# Patient Record
Sex: Female | Born: 1955
Health system: Southern US, Community
[De-identification: ages and names within clinical notes are randomized; demographics above are authoritative.]

## PROBLEM LIST (undated history)

## (undated) DIAGNOSIS — H409 Unspecified glaucoma: Secondary | ICD-10-CM

## (undated) DIAGNOSIS — Z86010 Personal history of colonic polyps: Secondary | ICD-10-CM

## (undated) DIAGNOSIS — H269 Unspecified cataract: Secondary | ICD-10-CM

## (undated) DIAGNOSIS — M810 Age-related osteoporosis without current pathological fracture: Secondary | ICD-10-CM

## (undated) HISTORY — PX: COLONOSCOPY: SHX174

## (undated) HISTORY — DX: Unspecified glaucoma: H40.9

## (undated) HISTORY — PX: ABDOMINAL HYSTERECTOMY: SHX81

## (undated) HISTORY — DX: Unspecified cataract: H26.9

## (undated) HISTORY — DX: Personal history of colonic polyps: Z86.010

## (undated) HISTORY — PX: COLON SURGERY: SHX602

## (undated) HISTORY — DX: Age-related osteoporosis without current pathological fracture: M81.0

---

## 1999-05-20 ENCOUNTER — Other Ambulatory Visit: Admission: RE | Admit: 1999-05-20 | Discharge: 1999-05-20 | Payer: Self-pay | Admitting: Obstetrics and Gynecology

## 1999-10-02 ENCOUNTER — Encounter: Admission: RE | Admit: 1999-10-02 | Discharge: 1999-10-02 | Payer: Self-pay | Admitting: Obstetrics and Gynecology

## 1999-10-02 ENCOUNTER — Encounter: Payer: Self-pay | Admitting: Obstetrics and Gynecology

## 2001-03-06 ENCOUNTER — Encounter: Payer: Self-pay | Admitting: Obstetrics and Gynecology

## 2001-03-06 ENCOUNTER — Encounter: Admission: RE | Admit: 2001-03-06 | Discharge: 2001-03-06 | Payer: Self-pay | Admitting: Obstetrics and Gynecology

## 2001-03-20 ENCOUNTER — Other Ambulatory Visit: Admission: RE | Admit: 2001-03-20 | Discharge: 2001-03-20 | Payer: Self-pay | Admitting: Obstetrics and Gynecology

## 2002-03-05 ENCOUNTER — Encounter: Payer: Self-pay | Admitting: Gynecology

## 2002-03-05 ENCOUNTER — Encounter: Admission: RE | Admit: 2002-03-05 | Discharge: 2002-03-05 | Payer: Self-pay | Admitting: Gynecology

## 2002-03-08 ENCOUNTER — Encounter: Payer: Self-pay | Admitting: Gynecology

## 2002-03-08 ENCOUNTER — Encounter: Admission: RE | Admit: 2002-03-08 | Discharge: 2002-03-08 | Payer: Self-pay | Admitting: Gynecology

## 2002-03-28 ENCOUNTER — Other Ambulatory Visit: Admission: RE | Admit: 2002-03-28 | Discharge: 2002-03-28 | Payer: Self-pay | Admitting: Gynecology

## 2003-03-11 ENCOUNTER — Encounter: Admission: RE | Admit: 2003-03-11 | Discharge: 2003-03-11 | Payer: Self-pay | Admitting: Gynecology

## 2003-03-11 ENCOUNTER — Encounter: Payer: Self-pay | Admitting: Gynecology

## 2003-05-15 ENCOUNTER — Other Ambulatory Visit: Admission: RE | Admit: 2003-05-15 | Discharge: 2003-05-15 | Payer: Self-pay | Admitting: Gynecology

## 2004-02-26 DIAGNOSIS — D229 Melanocytic nevi, unspecified: Secondary | ICD-10-CM

## 2004-02-26 HISTORY — DX: Melanocytic nevi, unspecified: D22.9

## 2004-06-02 ENCOUNTER — Ambulatory Visit (HOSPITAL_COMMUNITY): Admission: RE | Admit: 2004-06-02 | Discharge: 2004-06-02 | Payer: Self-pay | Admitting: Gynecology

## 2004-06-25 ENCOUNTER — Other Ambulatory Visit: Admission: RE | Admit: 2004-06-25 | Discharge: 2004-06-25 | Payer: Self-pay | Admitting: Gynecology

## 2005-07-13 ENCOUNTER — Ambulatory Visit (HOSPITAL_COMMUNITY): Admission: RE | Admit: 2005-07-13 | Discharge: 2005-07-13 | Payer: Self-pay | Admitting: Gynecology

## 2005-08-20 ENCOUNTER — Other Ambulatory Visit: Admission: RE | Admit: 2005-08-20 | Discharge: 2005-08-20 | Payer: Self-pay | Admitting: Gynecology

## 2006-08-09 ENCOUNTER — Ambulatory Visit (HOSPITAL_COMMUNITY): Admission: RE | Admit: 2006-08-09 | Discharge: 2006-08-09 | Payer: Self-pay | Admitting: Family Medicine

## 2006-08-23 ENCOUNTER — Encounter: Admission: RE | Admit: 2006-08-23 | Discharge: 2006-08-23 | Payer: Self-pay | Admitting: Family Medicine

## 2006-09-02 ENCOUNTER — Ambulatory Visit: Payer: Self-pay | Admitting: Internal Medicine

## 2006-09-30 ENCOUNTER — Ambulatory Visit: Payer: Self-pay | Admitting: Internal Medicine

## 2007-03-03 ENCOUNTER — Encounter: Admission: RE | Admit: 2007-03-03 | Discharge: 2007-03-03 | Payer: Self-pay | Admitting: Family Medicine

## 2007-09-08 ENCOUNTER — Encounter: Admission: RE | Admit: 2007-09-08 | Discharge: 2007-09-08 | Payer: Self-pay | Admitting: Family Medicine

## 2007-10-03 ENCOUNTER — Other Ambulatory Visit: Admission: RE | Admit: 2007-10-03 | Discharge: 2007-10-03 | Payer: Self-pay | Admitting: Gynecology

## 2008-10-07 ENCOUNTER — Encounter: Admission: RE | Admit: 2008-10-07 | Discharge: 2008-10-07 | Payer: Self-pay | Admitting: Gynecology

## 2009-10-08 ENCOUNTER — Other Ambulatory Visit: Admission: RE | Admit: 2009-10-08 | Discharge: 2009-10-08 | Payer: Self-pay | Admitting: Gynecology

## 2009-10-08 ENCOUNTER — Ambulatory Visit: Payer: Self-pay | Admitting: Women's Health

## 2010-01-30 ENCOUNTER — Encounter: Admission: RE | Admit: 2010-01-30 | Discharge: 2010-01-30 | Payer: Self-pay | Admitting: Gynecology

## 2010-10-09 ENCOUNTER — Ambulatory Visit: Payer: Self-pay | Admitting: Women's Health

## 2011-01-25 ENCOUNTER — Other Ambulatory Visit (HOSPITAL_COMMUNITY)
Admission: RE | Admit: 2011-01-25 | Discharge: 2011-01-25 | Disposition: A | Discharge status: Home / Self Care | Payer: 59 | Source: Ambulatory Visit | Attending: Gynecology | Admitting: Gynecology

## 2011-01-25 ENCOUNTER — Encounter (INDEPENDENT_AMBULATORY_CARE_PROVIDER_SITE_OTHER): Payer: 59 | Admitting: Women's Health

## 2011-01-25 ENCOUNTER — Other Ambulatory Visit: Payer: Self-pay | Admitting: Women's Health

## 2011-01-25 DIAGNOSIS — Z01419 Encounter for gynecological examination (general) (routine) without abnormal findings: Secondary | ICD-10-CM

## 2011-01-25 DIAGNOSIS — Z13220 Encounter for screening for lipoid disorders: Secondary | ICD-10-CM

## 2011-01-25 DIAGNOSIS — Z833 Family history of diabetes mellitus: Secondary | ICD-10-CM

## 2011-01-25 DIAGNOSIS — E079 Disorder of thyroid, unspecified: Secondary | ICD-10-CM

## 2011-02-24 ENCOUNTER — Other Ambulatory Visit: Payer: Self-pay | Admitting: Gynecology

## 2011-02-24 DIAGNOSIS — Z1231 Encounter for screening mammogram for malignant neoplasm of breast: Secondary | ICD-10-CM

## 2011-04-27 ENCOUNTER — Encounter (INDEPENDENT_AMBULATORY_CARE_PROVIDER_SITE_OTHER): Payer: 59

## 2011-04-27 ENCOUNTER — Ambulatory Visit: Payer: 59

## 2011-04-27 DIAGNOSIS — M899 Disorder of bone, unspecified: Secondary | ICD-10-CM

## 2011-05-28 ENCOUNTER — Other Ambulatory Visit: Payer: Self-pay

## 2011-05-28 ENCOUNTER — Ambulatory Visit: Payer: 59

## 2011-06-23 ENCOUNTER — Ambulatory Visit (INDEPENDENT_AMBULATORY_CARE_PROVIDER_SITE_OTHER): Payer: 59 | Admitting: General Surgery

## 2011-06-30 ENCOUNTER — Other Ambulatory Visit: Payer: Self-pay | Admitting: Dermatology

## 2011-06-30 ENCOUNTER — Ambulatory Visit: Payer: 59

## 2011-06-30 ENCOUNTER — Ambulatory Visit
Admission: RE | Admit: 2011-06-30 | Discharge: 2011-06-30 | Disposition: A | Discharge status: Home / Self Care | Payer: 59 | Source: Ambulatory Visit | Attending: Gynecology | Admitting: Gynecology

## 2011-06-30 DIAGNOSIS — Z1231 Encounter for screening mammogram for malignant neoplasm of breast: Secondary | ICD-10-CM

## 2011-07-09 ENCOUNTER — Ambulatory Visit (INDEPENDENT_AMBULATORY_CARE_PROVIDER_SITE_OTHER): Payer: 59 | Admitting: General Surgery

## 2012-01-28 ENCOUNTER — Other Ambulatory Visit: Payer: Self-pay | Admitting: Physician Assistant

## 2012-04-03 ENCOUNTER — Encounter (INDEPENDENT_AMBULATORY_CARE_PROVIDER_SITE_OTHER): Payer: 59 | Admitting: Ophthalmology

## 2012-04-03 DIAGNOSIS — H2510 Age-related nuclear cataract, unspecified eye: Secondary | ICD-10-CM

## 2012-04-03 DIAGNOSIS — H3530 Unspecified macular degeneration: Secondary | ICD-10-CM

## 2012-04-03 DIAGNOSIS — H43819 Vitreous degeneration, unspecified eye: Secondary | ICD-10-CM

## 2012-04-03 DIAGNOSIS — Q158 Other specified congenital malformations of eye: Secondary | ICD-10-CM

## 2012-04-03 DIAGNOSIS — H35439 Paving stone degeneration of retina, unspecified eye: Secondary | ICD-10-CM

## 2012-07-18 ENCOUNTER — Other Ambulatory Visit: Payer: Self-pay | Admitting: Gynecology

## 2012-07-18 DIAGNOSIS — Z1231 Encounter for screening mammogram for malignant neoplasm of breast: Secondary | ICD-10-CM

## 2012-08-11 ENCOUNTER — Ambulatory Visit
Admission: RE | Admit: 2012-08-11 | Discharge: 2012-08-11 | Disposition: A | Discharge status: Home / Self Care | Payer: 59 | Source: Ambulatory Visit | Attending: Gynecology | Admitting: Gynecology

## 2012-08-11 DIAGNOSIS — Z1231 Encounter for screening mammogram for malignant neoplasm of breast: Secondary | ICD-10-CM

## 2012-08-18 ENCOUNTER — Encounter: Payer: Self-pay | Admitting: Women's Health

## 2012-08-18 ENCOUNTER — Ambulatory Visit (INDEPENDENT_AMBULATORY_CARE_PROVIDER_SITE_OTHER): Payer: 59 | Admitting: Women's Health

## 2012-08-18 VITALS — BP 122/78 | Ht 65.5 in | Wt 124.0 lb

## 2012-08-18 DIAGNOSIS — Z833 Family history of diabetes mellitus: Secondary | ICD-10-CM

## 2012-08-18 DIAGNOSIS — M8580 Other specified disorders of bone density and structure, unspecified site: Secondary | ICD-10-CM | POA: Insufficient documentation

## 2012-08-18 DIAGNOSIS — Z13220 Encounter for screening for lipoid disorders: Secondary | ICD-10-CM

## 2012-08-18 DIAGNOSIS — M899 Disorder of bone, unspecified: Secondary | ICD-10-CM

## 2012-08-18 DIAGNOSIS — M810 Age-related osteoporosis without current pathological fracture: Secondary | ICD-10-CM | POA: Insufficient documentation

## 2012-08-18 DIAGNOSIS — Z01419 Encounter for gynecological examination (general) (routine) without abnormal findings: Secondary | ICD-10-CM

## 2012-08-18 LAB — COMPREHENSIVE METABOLIC PANEL
AST: 17 U/L (ref 0–37)
Albumin: 4.1 g/dL (ref 3.5–5.2)
Alkaline Phosphatase: 45 U/L (ref 39–117)
Glucose, Bld: 84 mg/dL (ref 70–99)
Potassium: 4.1 mEq/L (ref 3.5–5.3)
Sodium: 138 mEq/L (ref 135–145)
Total Protein: 6.8 g/dL (ref 6.0–8.3)

## 2012-08-18 LAB — LIPID PANEL: LDL Cholesterol: 99 mg/dL (ref 0–99)

## 2012-08-18 NOTE — Progress Notes (Signed)
TRANY CHERNICK 1956/02/07 [id number]    History:    The patient presents for annual exam.  Postmenopausal/TVH-fibroids/no ERT without complaint. Osteopenia, T score -1.7 at hip 04/2011. Frax score not elevated. History of normal Paps and mammograms. Negative colonoscopy 2008.   Past medical history, past surgical history, family history and social history were all reviewed and documented in the EPIC chart. Parents both hypertensive, mother recently placed on dialysis.   ROS:  A  ROS was performed and pertinent positives and negatives are included in the history.  Exam:  Filed Vitals:   08/18/12 0838  BP: 122/78    General appearance:  Normal Head/Neck:  Normal, without cervical or supraclavicular adenopathy. Thyroid:  Symmetrical, normal in size, without palpable masses or nodularity. Respiratory  Effort:  Normal  Auscultation:  Clear without wheezing or rhonchi Cardiovascular  Auscultation:  Regular rate, without rubs, murmurs or gallops  Edema/varicosities:  Not grossly evident Abdominal  Soft,nontender, without masses, guarding or rebound.  Liver/spleen:  No organomegaly noted  Hernia:  None appreciated  Skin  Inspection:  Grossly normal  Palpation:  Grossly normal Neurologic/psychiatric  Orientation:  Normal with appropriate conversation.  Mood/affect:  Normal  Genitourinary    Breasts: Examined lying and sitting.     Right: Without masses, retractions, discharge or axillary adenopathy.     Left: Without masses, retractions, discharge or axillary adenopathy.   Inguinal/mons:  Normal without inguinal adenopathy  External genitalia:  Normal  BUS/Urethra/Skene's glands:  Normal  Bladder:  Normal  Vagina:  Normal  Cervix:  Absent  Uterus:  Absent  Adnexa/parametria:     Rt: Without masses or tenderness.   Lt: Without masses or tenderness.  Anus and perineum: Normal  Digital rectal exam: Normal sphincter tone without palpated masses or  tenderness  Assessment/Plan:  56 y.o. M. WF G2 P2  for annual exam with no complaints.  Normal postmenopausal exam/TVH-fibroids Oteopenia T score -1.7 femoral neck 2012  Plan: Expressed concern  over mother's kidney failure, reviewed probably more due to  her hypertension which she does not have. Will check a comprehensive metabolic panel, lipid panel, UA, home Hemoccult card given with instructions. Pap normal 2012, new screening guidelines reviewed. SBE's, continue annual mammogram, calcium rich diet, vitamin D 2000 daily, continue regular exercise encouraged. Home safety and fall prevention discussed. Repeat bone density  next year.     Harrington Challenger [facility], 11:42 AM 08/18/2012

## 2012-08-18 NOTE — Patient Instructions (Signed)

## 2012-08-19 LAB — URINALYSIS W MICROSCOPIC + REFLEX CULTURE
Casts: NONE SEEN
Crystals: NONE SEEN
Leukocytes, UA: NEGATIVE
Nitrite: NEGATIVE
Specific Gravity, Urine: 1.017 (ref 1.005–1.030)
pH: 8 (ref 5.0–8.0)

## 2012-09-01 ENCOUNTER — Encounter: Payer: Self-pay | Admitting: Obstetrics and Gynecology

## 2012-11-25 ENCOUNTER — Other Ambulatory Visit: Payer: Self-pay

## 2013-08-01 ENCOUNTER — Other Ambulatory Visit: Payer: Self-pay

## 2013-08-01 DIAGNOSIS — Z1231 Encounter for screening mammogram for malignant neoplasm of breast: Secondary | ICD-10-CM

## 2013-08-16 ENCOUNTER — Other Ambulatory Visit: Payer: Self-pay

## 2013-09-05 ENCOUNTER — Ambulatory Visit
Admission: RE | Admit: 2013-09-05 | Discharge: 2013-09-05 | Disposition: A | Discharge status: Home / Self Care | Payer: 59 | Source: Ambulatory Visit

## 2013-09-05 ENCOUNTER — Other Ambulatory Visit: Payer: Self-pay | Admitting: Physician Assistant

## 2013-09-05 DIAGNOSIS — Z1231 Encounter for screening mammogram for malignant neoplasm of breast: Secondary | ICD-10-CM

## 2013-09-28 ENCOUNTER — Other Ambulatory Visit: Payer: Self-pay | Admitting: Physician Assistant

## 2014-03-29 ENCOUNTER — Other Ambulatory Visit: Payer: Self-pay | Admitting: Physician Assistant

## 2014-07-26 ENCOUNTER — Other Ambulatory Visit: Payer: Self-pay

## 2014-08-12 ENCOUNTER — Encounter: Payer: Self-pay | Admitting: Women's Health

## 2014-08-13 ENCOUNTER — Other Ambulatory Visit: Payer: Self-pay

## 2014-08-13 DIAGNOSIS — Z1231 Encounter for screening mammogram for malignant neoplasm of breast: Secondary | ICD-10-CM

## 2014-10-02 ENCOUNTER — Encounter: Payer: Self-pay | Admitting: Women's Health

## 2014-10-02 ENCOUNTER — Ambulatory Visit (INDEPENDENT_AMBULATORY_CARE_PROVIDER_SITE_OTHER): Payer: 59 | Admitting: Women's Health

## 2014-10-02 ENCOUNTER — Ambulatory Visit
Admission: RE | Admit: 2014-10-02 | Discharge: 2014-10-02 | Disposition: A | Discharge status: Home / Self Care | Payer: 59 | Source: Ambulatory Visit

## 2014-10-02 ENCOUNTER — Other Ambulatory Visit: Payer: Self-pay | Admitting: Physician Assistant

## 2014-10-02 VITALS — BP 106/64 | Ht 65.5 in | Wt 119.0 lb

## 2014-10-02 DIAGNOSIS — Z01419 Encounter for gynecological examination (general) (routine) without abnormal findings: Secondary | ICD-10-CM

## 2014-10-02 DIAGNOSIS — Z1231 Encounter for screening mammogram for malignant neoplasm of breast: Secondary | ICD-10-CM

## 2014-10-02 DIAGNOSIS — M8580 Other specified disorders of bone density and structure, unspecified site: Secondary | ICD-10-CM

## 2014-10-02 LAB — COMPREHENSIVE METABOLIC PANEL
ALT: 16 U/L (ref 0–35)
AST: 18 U/L (ref 0–37)
Albumin: 4 g/dL (ref 3.5–5.2)
Alkaline Phosphatase: 36 U/L — ABNORMAL LOW (ref 39–117)
BILIRUBIN TOTAL: 0.6 mg/dL (ref 0.2–1.2)
BUN: 16 mg/dL (ref 6–23)
CHLORIDE: 102 meq/L (ref 96–112)
CO2: 26 mEq/L (ref 19–32)
CREATININE: 0.78 mg/dL (ref 0.50–1.10)
Calcium: 9.1 mg/dL (ref 8.4–10.5)
Glucose, Bld: 84 mg/dL (ref 70–99)
Potassium: 4.1 mEq/L (ref 3.5–5.3)
SODIUM: 139 meq/L (ref 135–145)
TOTAL PROTEIN: 6.2 g/dL (ref 6.0–8.3)

## 2014-10-02 LAB — CBC WITH DIFFERENTIAL/PLATELET
BASOS ABS: 0 10*3/uL (ref 0.0–0.1)
Basophils Relative: 1 % (ref 0–1)
EOS ABS: 0.1 10*3/uL (ref 0.0–0.7)
EOS PCT: 3 % (ref 0–5)
HCT: 40.2 % (ref 36.0–46.0)
HEMOGLOBIN: 13 g/dL (ref 12.0–15.0)
LYMPHS ABS: 1.4 10*3/uL (ref 0.7–4.0)
LYMPHS PCT: 33 % (ref 12–46)
MCH: 30.9 pg (ref 26.0–34.0)
MCHC: 32.3 g/dL (ref 30.0–36.0)
MCV: 95.5 fL (ref 78.0–100.0)
MPV: 12.6 fL — AB (ref 9.4–12.4)
Monocytes Absolute: 0.3 10*3/uL (ref 0.1–1.0)
Monocytes Relative: 7 % (ref 3–12)
NEUTROS PCT: 56 % (ref 43–77)
Neutro Abs: 2.4 10*3/uL (ref 1.7–7.7)
PLATELETS: 166 10*3/uL (ref 150–400)
RBC: 4.21 MIL/uL (ref 3.87–5.11)
RDW: 12.7 % (ref 11.5–15.5)
WBC: 4.2 10*3/uL (ref 4.0–10.5)

## 2014-10-02 NOTE — Patient Instructions (Signed)
Health Recommendations for Postmenopausal Women Respected and ongoing research has looked at the most common causes of death, disability, and poor quality of life in postmenopausal women. The causes include heart disease, diseases of blood vessels, diabetes, depression, cancer, and bone loss (osteoporosis). Many things can be done to help lower the chances of developing these and other common problems. CARDIOVASCULAR DISEASE Heart Disease: A heart attack is a medical emergency. Know the signs and symptoms of a heart attack. Below are things women can do to reduce their risk for heart disease.   Do not smoke. If you smoke, quit.  Aim for a healthy weight. Being overweight causes many preventable deaths. Eat a healthy and balanced diet and drink an adequate amount of liquids.  Get moving. Make a commitment to be more physically active. Aim for 30 minutes of activity on most, if not all days of the week.  Eat for heart health. Choose a diet that is low in saturated fat and cholesterol and eliminate trans fat. Include whole grains, vegetables, and fruits. Read and understand the labels on food containers before buying.  Know your numbers. Ask your caregiver to check your blood pressure, cholesterol (total, HDL, LDL, triglycerides) and blood glucose. Work with your caregiver on improving your entire clinical picture.  High blood pressure. Limit or stop your table salt intake (try salt substitute and food seasonings). Avoid salty foods and drinks. Read labels on food containers before buying. Eating well and exercising can help control high blood pressure. STROKE  Stroke is a medical emergency. Stroke may be the result of a blood clot in a blood vessel in the brain or by a brain hemorrhage (bleeding). Know the signs and symptoms of a stroke. To lower the risk of developing a stroke:  Avoid fatty foods.  Quit smoking.  Control your diabetes, blood pressure, and irregular heart rate. THROMBOPHLEBITIS  (BLOOD CLOT) OF THE LEG  Becoming overweight and leading a stationary lifestyle may also contribute to developing blood clots. Controlling your diet and exercising will help lower the risk of developing blood clots. CANCER SCREENING  Breast Cancer: Take steps to reduce your risk of breast cancer.  You should practice "breast self-awareness." This means understanding the normal appearance and feel of your breasts and should include breast self-examination. Any changes detected, no matter how small, should be reported to your caregiver.  After age 40, you should have a clinical breast exam (CBE) every year.  Starting at age 40, you should consider having a mammogram (breast X-ray) every year.  If you have a family history of breast cancer, talk to your caregiver about genetic screening.  If you are at high risk for breast cancer, talk to your caregiver about having an MRI and a mammogram every year.  Intestinal or Stomach Cancer: Tests to consider are a rectal exam, fecal occult blood, sigmoidoscopy, and colonoscopy. Women who are high risk may need to be screened at an earlier age and more often.  Cervical Cancer:  Beginning at age 30, you should have a Pap test every 3 years as long as the past 3 Pap tests have been normal.  If you have had past treatment for cervical cancer or a condition that could lead to cancer, you need Pap tests and screening for cancer for at least 20 years after your treatment.  If you had a hysterectomy for a problem that was not cancer or a condition that could lead to cancer, then you no longer need Pap tests.    If you are between ages 65 and 70, and you have had normal Pap tests going back 10 years, you no longer need Pap tests.  If Pap tests have been discontinued, risk factors (such as a new sexual partner) need to be reassessed to determine if screening should be resumed.  Some medical problems can increase the chance of getting cervical cancer. In these  cases, your caregiver may recommend more frequent screening and Pap tests.  Uterine Cancer: If you have vaginal bleeding after reaching menopause, you should notify your caregiver.  Ovarian Cancer: Other than yearly pelvic exams, there are no reliable tests available to screen for ovarian cancer at this time except for yearly pelvic exams.  Lung Cancer: Yearly chest X-rays can detect lung cancer and should be done on high risk women, such as cigarette smokers and women with chronic lung disease (emphysema).  Skin Cancer: A complete body skin exam should be done at your yearly examination. Avoid overexposure to the sun and ultraviolet light lamps. Use a strong sun block cream when in the sun. All of these things are important for lowering the risk of skin cancer. MENOPAUSE Menopause Symptoms: Hormone therapy products are effective for treating symptoms associated with menopause:  Moderate to severe hot flashes.  Night sweats.  Mood swings.  Headaches.  Tiredness.  Loss of sex drive.  Insomnia.  Other symptoms. Hormone replacement carries certain risks, especially in older women. Women who use or are thinking about using estrogen or estrogen with progestin treatments should discuss that with their caregiver. Your caregiver will help you understand the benefits and risks. The ideal dose of hormone replacement therapy is not known. The Food and Drug Administration (FDA) has concluded that hormone therapy should be used only at the lowest doses and for the shortest amount of time to reach treatment goals.  OSTEOPOROSIS Protecting Against Bone Loss and Preventing Fracture If you use hormone therapy for prevention of bone loss (osteoporosis), the risks for bone loss must outweigh the risk of the therapy. Ask your caregiver about other medications known to be safe and effective for preventing bone loss and fractures. To guard against bone loss or fractures, the following is recommended:  If  you are younger than age 50, take 1000 mg of calcium and at least 600 mg of Vitamin D per day.  If you are older than age 50 but younger than age 70, take 1200 mg of calcium and at least 600 mg of Vitamin D per day.  If you are older than age 70, take 1200 mg of calcium and at least 800 mg of Vitamin D per day. Smoking and excessive alcohol intake increases the risk of osteoporosis. Eat foods rich in calcium and vitamin D and do weight bearing exercises several times a week as your caregiver suggests. DIABETES Diabetes Mellitus: If you have type I or type 2 diabetes, you should keep your blood sugar under control with diet, exercise, and recommended medication. Avoid starchy and fatty foods, and too many sweets. Being overweight can make diabetes control more difficult. COGNITION AND MEMORY Cognition and Memory: Menopausal hormone therapy is not recommended for the prevention of cognitive disorders such as Alzheimer's disease or memory loss.  DEPRESSION  Depression may occur at any age, but it is common in elderly women. This may be because of physical, medical, social (loneliness), or financial problems and needs. If you are experiencing depression because of medical problems and control of symptoms, talk to your caregiver about this. Physical   activity and exercise may help with mood and sleep. Community and volunteer involvement may improve your sense of value and worth. If you have depression and you feel that the problem is getting worse or becoming severe, talk to your caregiver about which treatment options are best for you. ACCIDENTS  Accidents are common and can be serious in elderly woman. Prepare your house to prevent accidents. Eliminate throw rugs, place hand bars in bath, shower, and toilet areas. Avoid wearing high heeled shoes or walking on wet, snowy, and icy areas. Limit or stop driving if you have vision or hearing problems, or if you feel you are unsteady with your movements and  reflexes. HEPATITIS C Hepatitis C is a type of viral infection affecting the liver. It is spread mainly through contact with blood from an infected person. It can be treated, but if left untreated, it can lead to severe liver damage over the years. Many people who are infected do not know that the virus is in their blood. If you are a "baby-boomer", it is recommended that you have one screening test for Hepatitis C. IMMUNIZATIONS  Several immunizations are important to consider having during your senior years, including:   Tetanus, diphtheria, and pertussis booster shot.  Influenza every year before the flu season begins.  Pneumonia vaccine.  Shingles vaccine.  Others, as indicated based on your specific needs. Talk to your caregiver about these. Document Released: 11/19/2005 Document Revised: 02/11/2014 Document Reviewed: 07/15/2008 Exit[employer] Patient Information 2015 [employer]. This information is not intended to replace advice given to you by your health care provider. Make sure you discuss any questions you have with your health care provider.  

## 2014-10-02 NOTE — Progress Notes (Signed)
Krystal Collins 10-27-1955 [id number]    History:    Presents for annual exam.  TAH for fibroids on no HRT. Normal Pap and mammogram history. 2012 T score -1.7 at hip, FRAX 6.6%/0.7%. 2008 negative colonoscopy. Has had the flu vaccine, current on T dap.  Past medical history, past surgical history, family history and social history were all reviewed and documented in the EPIC chart. Cares for grandchildren in her home. Parents hypertension, mother dialysis, bedridden.  ROS:  A ROS was performed and pertinent positives and negatives are included.  Exam:  Filed Vitals:   10/02/14 0821  BP: 106/64    General appearance:  Normal Thyroid:  Symmetrical, normal in size, without palpable masses or nodularity. Respiratory  Auscultation:  Clear without wheezing or rhonchi Cardiovascular  Auscultation:  Regular rate, without rubs, murmurs or gallops  Edema/varicosities:  Not grossly evident Abdominal  Soft,nontender, without masses, guarding or rebound.  Liver/spleen:  No organomegaly noted  Hernia:  None appreciated  Skin  Inspection:  Grossly normal   Breasts: Examined lying and sitting.     Right: Without masses, retractions, discharge or axillary adenopathy.     Left: Without masses, retractions, discharge or axillary adenopathy. Gentitourinary   Inguinal/mons:  Normal without inguinal adenopathy  External genitalia:  Normal  BUS/Urethra/Skene's glands:  Normal  Vagina:  Normal  Cervix:  Absent  Uterus:  Absent  Adnexa/parametria:     Rt: Without masses or tenderness.   Lt: Without masses or tenderness.  Anus and perineum: Normal  Digital rectal exam: Normal sphincter tone without palpated masses or tenderness  Assessment/Plan:  58 y.o. MWF G2 P2 for annual exam with no complaints.  TAH for fibroids on no HRT Osteopenia without elevated FRAX  Plan: Zostavax recommended. Continue active lifestyle, regular exercise, calcium rich diet, vitamin D 2000 daily encouraged. SBE's,  continue annual screening mammogram. Home safety and fall prevention discussed, repeat DEXA will schedule. CBC, comprehensive metabolic panel, UA, vitamin D, Pap screening guidelines reviewed.  Krystal Collins [facility], 8:57 AM 10/02/2014

## 2014-10-03 LAB — URINALYSIS W MICROSCOPIC + REFLEX CULTURE
BACTERIA UA: NONE SEEN
BILIRUBIN URINE: NEGATIVE
CASTS: NONE SEEN
CRYSTALS: NONE SEEN
Glucose, UA: NEGATIVE mg/dL
HGB URINE DIPSTICK: NEGATIVE
KETONES UR: NEGATIVE mg/dL
Leukocytes, UA: NEGATIVE
Nitrite: NEGATIVE
PH: 7.5 (ref 5.0–8.0)
Protein, ur: NEGATIVE mg/dL
SPECIFIC GRAVITY, URINE: 1.017 (ref 1.005–1.030)
Squamous Epithelial / LPF: NONE SEEN
Urobilinogen, UA: 0.2 mg/dL (ref 0.0–1.0)

## 2014-10-03 LAB — VITAMIN D 25 HYDROXY (VIT D DEFICIENCY, FRACTURES): Vit D, 25-Hydroxy: 37 ng/mL (ref 30–100)

## 2014-10-08 ENCOUNTER — Other Ambulatory Visit: Payer: Self-pay | Admitting: Women's Health

## 2014-10-24 ENCOUNTER — Other Ambulatory Visit: Payer: Self-pay | Admitting: Physician Assistant

## 2015-01-31 ENCOUNTER — Other Ambulatory Visit: Payer: Self-pay | Admitting: Physician Assistant

## 2015-10-08 ENCOUNTER — Other Ambulatory Visit: Payer: Self-pay

## 2015-10-08 DIAGNOSIS — Z1231 Encounter for screening mammogram for malignant neoplasm of breast: Secondary | ICD-10-CM

## 2015-10-09 ENCOUNTER — Ambulatory Visit
Admission: RE | Admit: 2015-10-09 | Discharge: 2015-10-09 | Disposition: A | Discharge status: Home / Self Care | Payer: 59 | Source: Ambulatory Visit

## 2015-10-09 DIAGNOSIS — Z1231 Encounter for screening mammogram for malignant neoplasm of breast: Secondary | ICD-10-CM

## 2015-10-10 ENCOUNTER — Encounter: Payer: Self-pay | Admitting: Women's Health

## 2016-07-09 ENCOUNTER — Ambulatory Visit (INDEPENDENT_AMBULATORY_CARE_PROVIDER_SITE_OTHER): Payer: 59 | Admitting: Women's Health

## 2016-07-09 ENCOUNTER — Encounter: Payer: Self-pay | Admitting: Women's Health

## 2016-07-09 VITALS — BP 109/72 | Ht 65.0 in | Wt 117.4 lb

## 2016-07-09 DIAGNOSIS — N952 Postmenopausal atrophic vaginitis: Secondary | ICD-10-CM

## 2016-07-09 DIAGNOSIS — M8580 Other specified disorders of bone density and structure, unspecified site: Secondary | ICD-10-CM

## 2016-07-09 DIAGNOSIS — Z01419 Encounter for gynecological examination (general) (routine) without abnormal findings: Secondary | ICD-10-CM

## 2016-07-09 MED ORDER — ESTRADIOL 2 MG VA RING: 2.0000 mg | VAGINAL_RING | VAGINAL | 4 refills | Status: DC

## 2016-07-09 NOTE — Patient Instructions (Signed)

## 2016-07-09 NOTE — Progress Notes (Addendum)
Krystal Collins Jul 03, 1956 [id number]    History:    Presents for annual exam.  TAH for fibroids on no HRT. Sexually active with vaginal dryness. Normal Pap and mammogram history. 2012 osteopenia with a T score of -1.7 FRAX 6.6%/0.7% repeat DEXA normal with T score of +1. 2008 negative colonoscopy. Received Zostavax 2016. Has had normal lipid panel and glucose.  Past medical history, past surgical history, family history and social history were all reviewed and documented in the EPIC chart. Parents hypertension. Mother recently died from multiple problems had been on dialysis.  ROS:  A ROS was performed and pertinent positives and negatives are included.  Exam:  Vitals:   07/09/16 1030  BP: 109/72  Weight: 117 lb 6.4 oz (53.3 kg)  Height: 5\' 5"  (1.651 m)   Body mass index is 19.54 kg/m.   General appearance:  Normal Thyroid:  Symmetrical, normal in size, without palpable masses or nodularity. Respiratory  Auscultation:  Clear without wheezing or rhonchi Cardiovascular  Auscultation:  Regular rate, without rubs, murmurs or gallops  Edema/varicosities:  Not grossly evident Abdominal  Soft,nontender, without masses, guarding or rebound.  Liver/spleen:  No organomegaly noted  Hernia:  None appreciated  Skin  Inspection:  Grossly normal   Breasts: Examined lying and sitting.     Right: Without masses, retractions, discharge or axillary adenopathy.     Left: Without masses, retractions, discharge or axillary adenopathy. Gentitourinary   Inguinal/mons:  Normal without inguinal adenopathy  External genitalia:  Normal  BUS/Urethra/Skene's glands:  Normal  Vagina:  Atrophic  Cervix:  Uterus absent Adnexa/parametria:     Rt: Without masses or tenderness.   Lt: Without masses or tenderness.  Anus and perineum: Normal  Digital rectal exam: Normal sphincter tone without palpated masses or tenderness  Assessment/Plan:  60 y.o. MWF G2 P2 for annual exam.     TAH for fibroids on no  HRT Vaginal atrophy/dyspareunia Normal health screening through [facility] 06/2016  Plan: Options reviewed minimal relief with lubricants, will try Estring prescription, proper use, reviewed minimal systemic absorption, coupon given. Will call if no relief. SBE's, continue annual screening mammogram, calcium rich diet, vitamin D 1000 daily encouraged. 2012 osteopenia out elevated FRAX, 2016 +1 , Home safety, fall prevention and importance of continuing weightbearing exercise reviewed. Vitamin D, UA, repeat colonoscopy next year.    [place], 1:19 PM 07/09/2016

## 2016-07-10 LAB — URINALYSIS W MICROSCOPIC + REFLEX CULTURE
Bacteria, UA: NONE SEEN [HPF]
Bilirubin Urine: NEGATIVE
CASTS: NONE SEEN [LPF]
Crystals: NONE SEEN [HPF]
Glucose, UA: NEGATIVE
Hgb urine dipstick: NEGATIVE
KETONES UR: NEGATIVE
Leukocytes, UA: NEGATIVE
Nitrite: NEGATIVE
PH: 7.5 (ref 5.0–8.0)
Protein, ur: NEGATIVE
RBC / HPF: NONE SEEN RBC/HPF (ref <=2)
SPECIFIC GRAVITY, URINE: 1.006 (ref 1.001–1.035)
Squamous Epithelial / LPF: NONE SEEN [HPF] (ref <=5)
WBC, UA: NONE SEEN WBC/HPF (ref <=5)
Yeast: NONE SEEN [HPF]

## 2016-07-10 LAB — VITAMIN D 25 HYDROXY (VIT D DEFICIENCY, FRACTURES): VIT D 25 HYDROXY: 44 ng/mL (ref 30–100)

## 2016-07-12 ENCOUNTER — Encounter: Payer: Self-pay | Admitting: Women's Health

## 2016-10-25 ENCOUNTER — Encounter: Payer: Self-pay | Admitting: Internal Medicine

## 2016-11-08 ENCOUNTER — Other Ambulatory Visit: Payer: Self-pay | Admitting: Gynecology

## 2016-11-08 DIAGNOSIS — Z1231 Encounter for screening mammogram for malignant neoplasm of breast: Secondary | ICD-10-CM

## 2016-11-12 ENCOUNTER — Other Ambulatory Visit: Payer: Self-pay | Admitting: Physician Assistant

## 2017-01-28 ENCOUNTER — Ambulatory Visit
Admission: RE | Admit: 2017-01-28 | Discharge: 2017-01-28 | Disposition: A | Discharge status: Home / Self Care | Payer: 59 | Source: Ambulatory Visit | Attending: Gynecology | Admitting: Gynecology

## 2017-01-28 DIAGNOSIS — Z1231 Encounter for screening mammogram for malignant neoplasm of breast: Secondary | ICD-10-CM

## 2017-02-01 ENCOUNTER — Other Ambulatory Visit: Payer: Self-pay | Admitting: Gynecology

## 2017-02-01 DIAGNOSIS — R928 Other abnormal and inconclusive findings on diagnostic imaging of breast: Secondary | ICD-10-CM

## 2017-02-04 ENCOUNTER — Ambulatory Visit
Admission: RE | Admit: 2017-02-04 | Discharge: 2017-02-04 | Disposition: A | Discharge status: Home / Self Care | Payer: 59 | Source: Ambulatory Visit | Attending: Gynecology | Admitting: Gynecology

## 2017-02-04 DIAGNOSIS — R928 Other abnormal and inconclusive findings on diagnostic imaging of breast: Secondary | ICD-10-CM

## 2017-02-23 ENCOUNTER — Encounter: Payer: Self-pay | Admitting: Gynecology

## 2018-02-07 ENCOUNTER — Other Ambulatory Visit: Payer: Self-pay | Admitting: Women's Health

## 2018-02-07 DIAGNOSIS — Z1231 Encounter for screening mammogram for malignant neoplasm of breast: Secondary | ICD-10-CM

## 2018-02-17 ENCOUNTER — Other Ambulatory Visit: Payer: Self-pay | Admitting: Physician Assistant

## 2018-03-10 ENCOUNTER — Ambulatory Visit: Payer: 59

## 2018-03-31 ENCOUNTER — Ambulatory Visit
Admission: RE | Admit: 2018-03-31 | Discharge: 2018-03-31 | Disposition: A | Discharge status: Home / Self Care | Payer: 59 | Source: Ambulatory Visit | Attending: Women's Health | Admitting: Women's Health

## 2018-03-31 DIAGNOSIS — Z1231 Encounter for screening mammogram for malignant neoplasm of breast: Secondary | ICD-10-CM

## 2018-04-01 ENCOUNTER — Encounter (INDEPENDENT_AMBULATORY_CARE_PROVIDER_SITE_OTHER): Payer: Self-pay

## 2018-08-23 IMAGING — MG DIGITAL SCREENING BILATERAL MAMMOGRAM WITH TOMO AND CAD
6 series · 6 of 18 positions shown · non-contrast
Comparison: Previous exam(s).

CLINICAL DATA: Screening.

EXAM:
DIGITAL SCREENING BILATERAL MAMMOGRAM WITH TOMO AND CAD

[R MLO synth-2D]
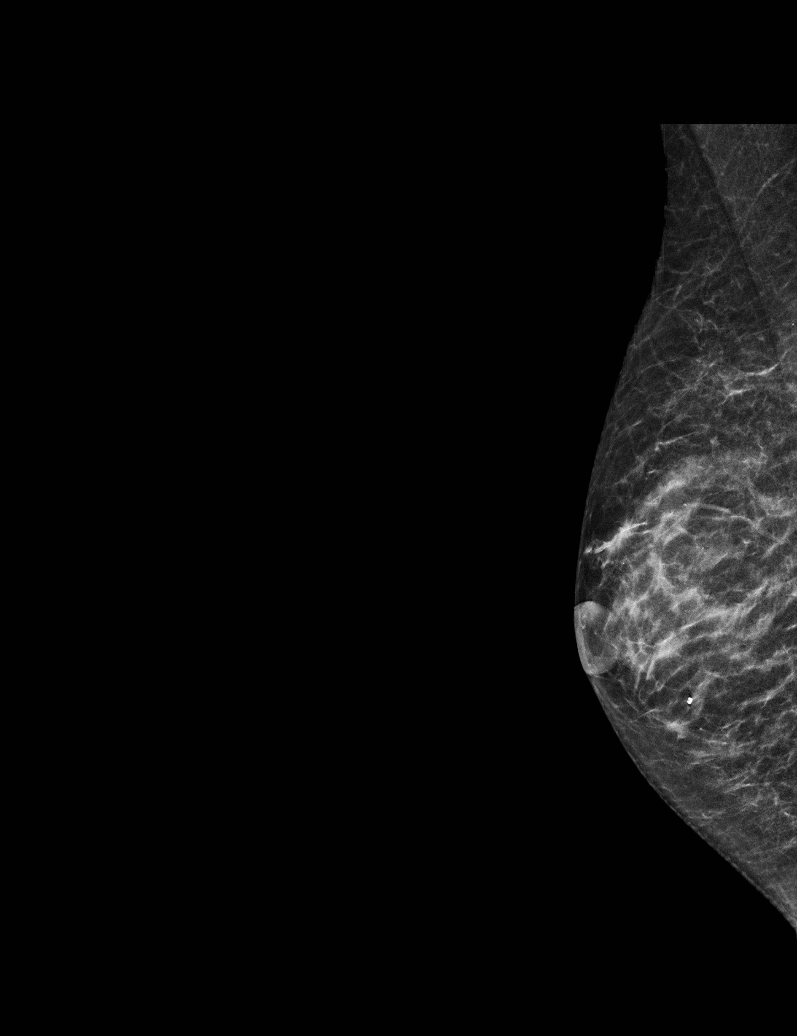

[L CC synth-2D]
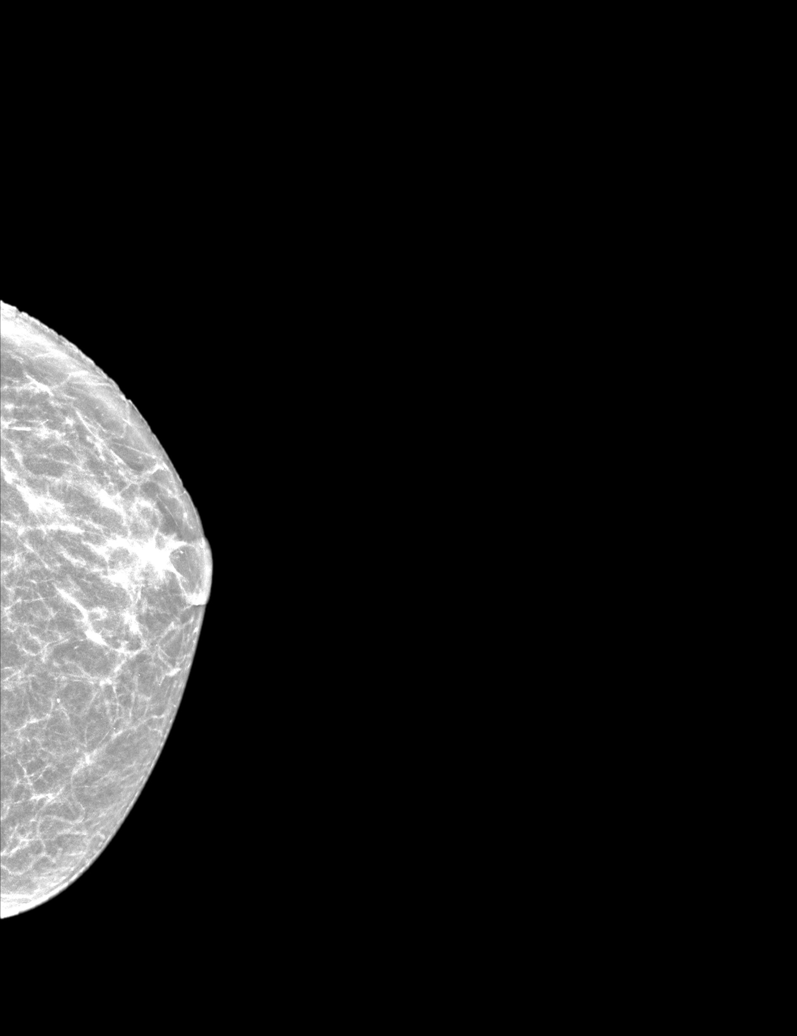

[R CC synth-2D]
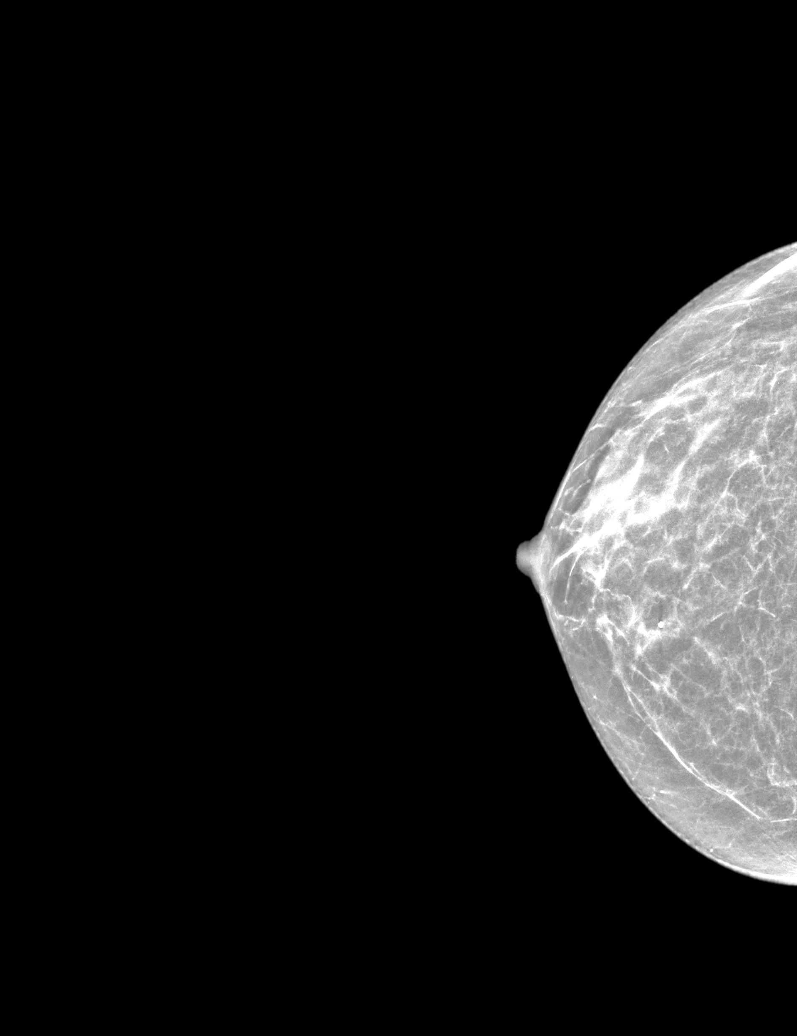

[L CC tomo · tomo slice 24/47.0]
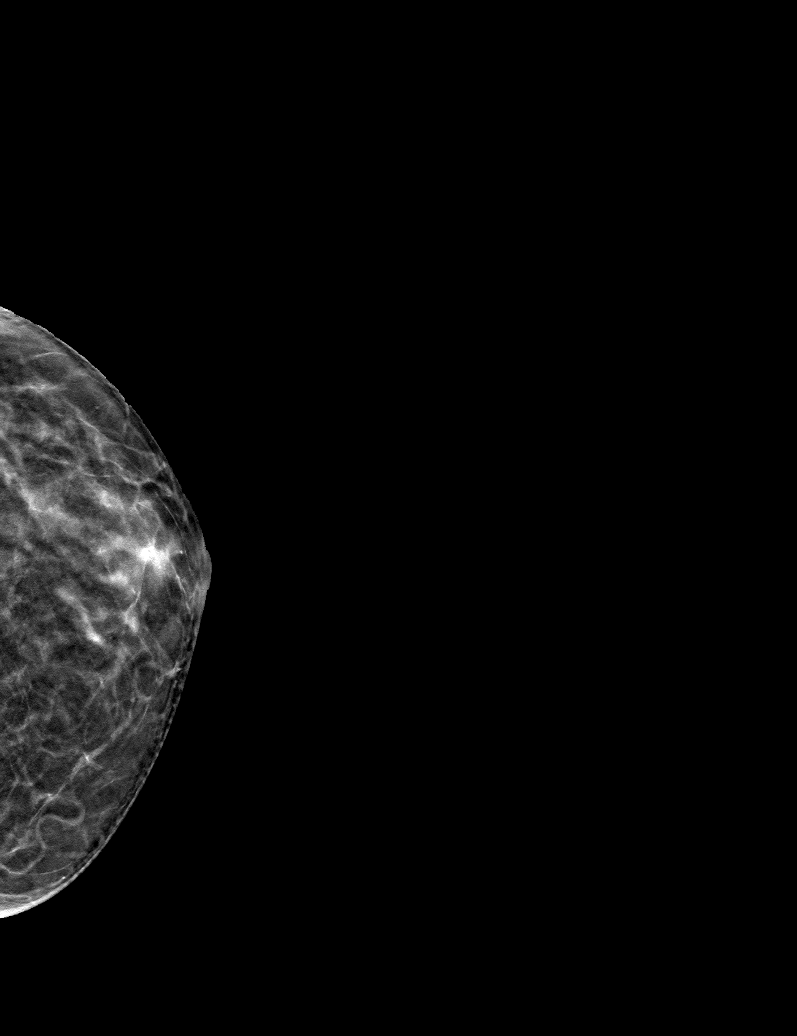

[L MLO tomo · tomo slice 24/47.0]
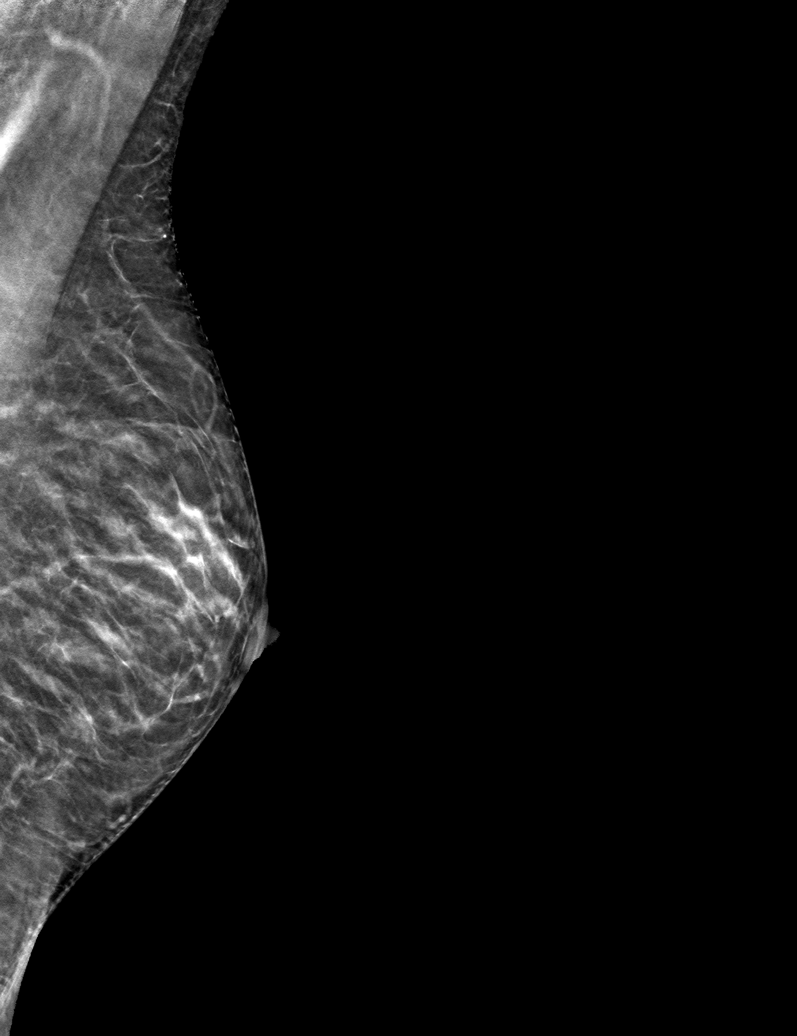

[R CC tomo · tomo slice 23/45.0]
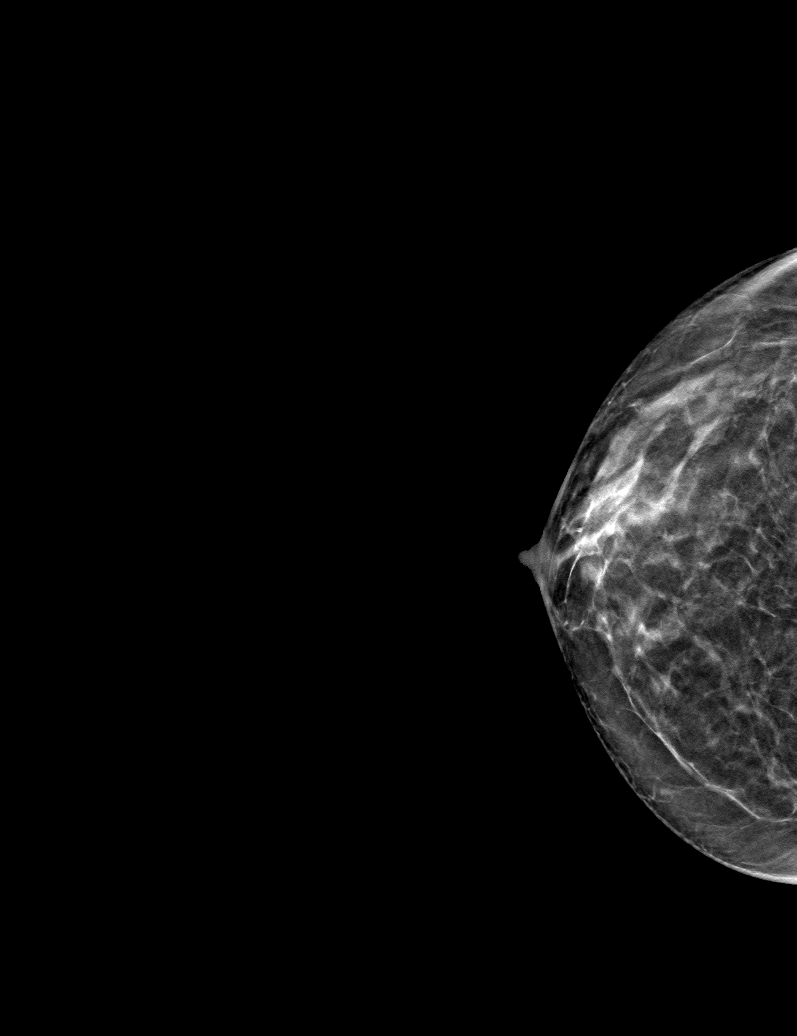

[6 of 18 positions shown; findings below may reference images not displayed]

ACR Breast Density Category c: The breast tissue is heterogeneously
dense, which may obscure small masses.
FINDINGS: There are no findings suspicious for malignancy. Images were
processed with CAD.
IMPRESSION: No mammographic evidence of malignancy. A result letter of this
screening mammogram will be mailed directly to the patient.

RECOMMENDATION:
Screening mammogram in one year. (Code:FT-U-LHB)

BI-RADS CATEGORY  1: Negative.

## 2018-08-28 ENCOUNTER — Ambulatory Visit (INDEPENDENT_AMBULATORY_CARE_PROVIDER_SITE_OTHER): Payer: 59 | Admitting: Women's Health

## 2018-08-28 ENCOUNTER — Encounter: Payer: Self-pay | Admitting: Women's Health

## 2018-08-28 VITALS — BP 122/80 | Ht 65.0 in | Wt 119.0 lb

## 2018-08-28 DIAGNOSIS — Z23 Encounter for immunization: Secondary | ICD-10-CM | POA: Diagnosis not present

## 2018-08-28 DIAGNOSIS — Z13220 Encounter for screening for lipoid disorders: Secondary | ICD-10-CM

## 2018-08-28 DIAGNOSIS — Z01419 Encounter for gynecological examination (general) (routine) without abnormal findings: Secondary | ICD-10-CM

## 2018-08-28 DIAGNOSIS — E559 Vitamin D deficiency, unspecified: Secondary | ICD-10-CM

## 2018-08-28 NOTE — Progress Notes (Signed)
Krystal Collins 07-20-1956 [id number]    History:    Presents for annual exam.  TAH for fibroids on no HRT.  Normal Pap and mammogram history.  Has had zostavac.  2012 osteopenia -1.7 FRAX 6.6% / 0.7%, repeat DEXA 2018 at primary care normal +1.  2008- colonoscopy questions if she could do Cologuard.  History of low vitamin D.   Past medical history, past surgical history, family history and social history were all reviewed and documented in the EPIC chart.  Recently retired from daycare work.  Parents hypertension, mother died from kidney disease.  Daughter has had several miscarriages desiring pregnancy.  ROS:  A ROS was performed and pertinent positives and negatives are included.  Exam:  Vitals:   08/28/18 0858  BP: 122/80  Weight: 119 lb (54 kg)  Height: 5\' 5"  (1.651 m)   Body mass index is 19.8 kg/m.   General appearance:  Normal Thyroid:  Symmetrical, normal in size, without palpable masses or nodularity. Respiratory  Auscultation:  Clear without wheezing or rhonchi Cardiovascular  Auscultation:  Regular rate, without rubs, murmurs or gallops  Edema/varicosities:  Not grossly evident Abdominal  Soft,nontender, without masses, guarding or rebound.  Liver/spleen:  No organomegaly noted  Hernia:  None appreciated  Skin  Inspection:  Grossly normal   Breasts: Examined lying and sitting.     Right: Without masses, retractions, discharge or axillary adenopathy.     Left: Without masses, retractions, discharge or axillary adenopathy. Gentitourinary   Inguinal/mons:  Normal without inguinal adenopathy  External genitalia:  Normal  BUS/Urethra/Skene's glands:  Normal  Vagina: Mild atrophy  Cervix: And uterus absent adnexa/parametria:     Rt: Without masses or tenderness.   Lt: Without masses or tenderness.  Anus and perineum: Normal  Digital rectal exam: Normal sphincter tone without palpated masses or tenderness  Assessment/Plan:  62 y.o. MWF G2 P2 for annual exam  with no complaints.  TAH for fibroids on no HRT  Plan: Reviewed colonoscopy gold standard, instructed to schedule.  SBE's, continue annual screening mammogram, calcium rich foods, vitamin D 2000 daily encouraged.  Encouraged to continue regular exercise, walking most days 1 hour, encouraged to increase balance type exercise as well.  Home safety and fall prevention discussed.  Shingrex reviewed and encouraged.  Pneumovax at 70.  CBC, CMP, lipid panel, vitamin D.   [name] [facility], 9:37 AM 08/28/2018

## 2018-08-28 NOTE — Patient Instructions (Signed)
Dr Carlean Purl  547-  Colonoscopy   [facility]   Health Maintenance for Postmenopausal Women Menopause is a normal process in which your reproductive ability comes to an end. This process happens gradually over a span of months to years, usually between the ages of 75 and 64. Menopause is complete when you have missed 12 consecutive menstrual periods. It is important to talk with your health care provider about some of the most common conditions that affect postmenopausal women, such as heart disease, cancer, and bone loss (osteoporosis). Adopting a healthy lifestyle and getting preventive care can help to promote your health and wellness. Those actions can also lower your chances of developing some of these common conditions. What should I know about menopause? During menopause, you may experience a number of symptoms, such as:  Moderate-to-severe hot flashes.  Night sweats.  Decrease in sex drive.  Mood swings.  Headaches.  Tiredness.  Irritability.  Memory problems.  Insomnia.  Choosing to treat or not to treat menopausal changes is an individual decision that you make with your health care provider. What should I know about hormone replacement therapy and supplements? Hormone therapy products are effective for treating symptoms that are associated with menopause, such as hot flashes and night sweats. Hormone replacement carries certain risks, especially as you become older. If you are thinking about using estrogen or estrogen with progestin treatments, discuss the benefits and risks with your health care provider. What should I know about heart disease and stroke? Heart disease, heart attack, and stroke become more likely as you age. This may be due, in part, to the hormonal changes that your body experiences during menopause. These can affect how your body processes dietary fats, triglycerides, and cholesterol. Heart attack and stroke are both medical emergencies. There are many  things that you can do to help prevent heart disease and stroke:  Have your blood pressure checked at least every 1-2 years. High blood pressure causes heart disease and increases the risk of stroke.  If you are 64-40 years old, ask your health care provider if you should take aspirin to prevent a heart attack or a stroke.  Do not use any tobacco products, including cigarettes, chewing tobacco, or electronic cigarettes. If you need help quitting, ask your health care provider.  It is important to eat a healthy diet and maintain a healthy weight. ? Be sure to include plenty of vegetables, fruits, low-fat dairy products, and lean protein. ? Avoid eating foods that are high in solid fats, added sugars, or salt (sodium).  Get regular exercise. This is one of the most important things that you can do for your health. ? Try to exercise for at least 150 minutes each week. The type of exercise that you do should increase your heart rate and make you sweat. This is known as moderate-intensity exercise. ? Try to do strengthening exercises at least twice each week. Do these in addition to the moderate-intensity exercise.  Know your numbers.Ask your health care provider to check your cholesterol and your blood glucose. Continue to have your blood tested as directed by your health care provider.  What should I know about cancer screening? There are several types of cancer. Take the following steps to reduce your risk and to catch any cancer development as early as possible. Breast Cancer  Practice breast self-awareness. ? This means understanding how your breasts normally appear and feel. ? It also means doing regular breast self-exams. Let your health care provider know about  changes, no matter how small.  If you are 64 or older, have a clinician do a breast exam (clinical breast exam or CBE) every year. Depending on your age, family history, and medical history, it may be recommended that you  also have a yearly breast X-ray (mammogram).  If you have a family history of breast cancer, talk with your health care provider about genetic screening.  If you are at high risk for breast cancer, talk with your health care provider about having an MRI and a mammogram every year.  Breast cancer (BRCA) gene test is recommended for women who have family members with BRCA-related cancers. Results of the assessment will determine the need for genetic counseling and BRCA1 and for BRCA2 testing. BRCA-related cancers include these types: ? Breast. This occurs in males or females. ? Ovarian. ? Tubal. This may also be called fallopian tube cancer. ? Cancer of the abdominal or pelvic lining (peritoneal cancer). ? Prostate. ? Pancreatic.  Cervical, Uterine, and Ovarian Cancer Your health care provider may recommend that you be screened regularly for cancer of the pelvic organs. These include your ovaries, uterus, and vagina. This screening involves a pelvic exam, which includes checking for microscopic changes to the surface of your cervix (Pap test).  For women ages 21-64, health care providers may recommend a pelvic exam and a Pap test every three years. For women ages 30-64, they may recommend the Pap test and pelvic exam, combined with testing for human papilloma virus (HPV), every five years. Some types of HPV increase your risk of cervical cancer. Testing for HPV may also be done on women of any age who have unclear Pap test results.  Other health care providers may not recommend any screening for nonpregnant women who are considered low risk for pelvic cancer and have no symptoms. Ask your health care provider if a screening pelvic exam is right for you.  If you have had past treatment for cervical cancer or a condition that could lead to cancer, you need Pap tests and screening for cancer for at least 20 years after your treatment. If Pap tests have been discontinued for you, your risk factors  (such as having a new sexual partner) need to be reassessed to determine if you should start having screenings again. Some women have medical problems that increase the chance of getting cervical cancer. In these cases, your health care provider may recommend that you have screening and Pap tests more often.  If you have a family history of uterine cancer or ovarian cancer, talk with your health care provider about genetic screening.  If you have vaginal bleeding after reaching menopause, tell your health care provider.  There are currently no reliable tests available to screen for ovarian cancer.  Lung Cancer Lung cancer screening is recommended for adults 55-80 years old who are at high risk for lung cancer because of a history of smoking. A yearly low-dose CT scan of the lungs is recommended if you:  Currently smoke.  Have a history of at least 30 pack-years of smoking and you currently smoke or have quit within the past 15 years. A pack-year is smoking an average of one pack of cigarettes per day for one year.  Yearly screening should:  Continue until it has been 15 years since you quit.  Stop if you develop a health problem that would prevent you from having lung cancer treatment.  Colorectal Cancer  This type of cancer can be detected and can often   be prevented.  Routine colorectal cancer screening usually begins at age 50 and continues through age 75.  If you have risk factors for colon cancer, your health care provider may recommend that you be screened at an earlier age.  If you have a family history of colorectal cancer, talk with your health care provider about genetic screening.  Your health care provider may also recommend using home test kits to check for hidden blood in your stool.  A small camera at the end of a tube can be used to examine your colon directly (sigmoidoscopy or colonoscopy). This is done to check for the earliest forms of colorectal cancer.  Direct  examination of the colon should be repeated every 5-10 years until age 75. However, if early forms of precancerous polyps or small growths are found or if you have a family history or genetic risk for colorectal cancer, you may need to be screened more often.  Skin Cancer  Check your skin from head to toe regularly.  Monitor any moles. Be sure to tell your health care provider: ? About any new moles or changes in moles, especially if there is a change in a mole's shape or color. ? If you have a mole that is larger than the size of a pencil eraser.  If any of your family members has a history of skin cancer, especially at a young age, talk with your health care provider about genetic screening.  Always use sunscreen. Apply sunscreen liberally and repeatedly throughout the day.  Whenever you are outside, protect yourself by wearing long sleeves, pants, a wide-brimmed hat, and sunglasses.  What should I know about osteoporosis? Osteoporosis is a condition in which bone destruction happens more quickly than new bone creation. After menopause, you may be at an increased risk for osteoporosis. To help prevent osteoporosis or the bone fractures that can happen because of osteoporosis, the following is recommended:  If you are 19-50 years old, get at least 1,000 mg of calcium and at least 600 mg of vitamin D per day.  If you are older than age 50 but younger than age 70, get at least 1,200 mg of calcium and at least 600 mg of vitamin D per day.  If you are older than age 70, get at least 1,200 mg of calcium and at least 800 mg of vitamin D per day.  Smoking and excessive alcohol intake increase the risk of osteoporosis. Eat foods that are rich in calcium and vitamin D, and do weight-bearing exercises several times each week as directed by your health care provider. What should I know about how menopause affects my mental health? Depression may occur at any age, but it is more common as you become  older. Common symptoms of depression include:  Low or sad mood.  Changes in sleep patterns.  Changes in appetite or eating patterns.  Feeling an overall lack of motivation or enjoyment of activities that you previously enjoyed.  Frequent crying spells.  Talk with your health care provider if you think that you are experiencing depression. What should I know about immunizations? It is important that you get and maintain your immunizations. These include:  Tetanus, diphtheria, and pertussis (Tdap) booster vaccine.  Influenza every year before the flu season begins.  Pneumonia vaccine.  Shingles vaccine.  Your health care provider may also recommend other immunizations. This information is not intended to replace advice given to you by your health care provider. Make sure you discuss any questions   questions you have with your health care provider. Document Released: 11/19/2005 Document Revised: 04/16/2016 Document Reviewed: 07/01/2015 Elsevier Interactive Patient Education  2018 [employer].

## 2018-08-29 LAB — CBC WITH DIFFERENTIAL/PLATELET
BASOS ABS: 29 {cells}/uL (ref 0–200)
Basophils Relative: 0.8 %
EOS ABS: 79 {cells}/uL (ref 15–500)
Eosinophils Relative: 2.2 %
HEMATOCRIT: 41.3 % (ref 35.0–45.0)
HEMOGLOBIN: 13.7 g/dL (ref 11.7–15.5)
Lymphs Abs: 1177 cells/uL (ref 850–3900)
MCH: 31.6 pg (ref 27.0–33.0)
MCHC: 33.2 g/dL (ref 32.0–36.0)
MCV: 95.4 fL (ref 80.0–100.0)
MONOS PCT: 6.1 %
MPV: 13 fL — ABNORMAL HIGH (ref 7.5–12.5)
NEUTROS ABS: 2095 {cells}/uL (ref 1500–7800)
NEUTROS PCT: 58.2 %
Platelets: 146 10*3/uL (ref 140–400)
RBC: 4.33 10*6/uL (ref 3.80–5.10)
RDW: 11.4 % (ref 11.0–15.0)
Total Lymphocyte: 32.7 %
WBC mixed population: 220 cells/uL (ref 200–950)
WBC: 3.6 10*3/uL — ABNORMAL LOW (ref 3.8–10.8)

## 2018-08-29 LAB — COMPREHENSIVE METABOLIC PANEL
AG RATIO: 1.9 (calc) (ref 1.0–2.5)
ALT: 13 U/L (ref 6–29)
AST: 15 U/L (ref 10–35)
Albumin: 4.3 g/dL (ref 3.6–5.1)
Alkaline phosphatase (APISO): 39 U/L (ref 33–130)
BILIRUBIN TOTAL: 0.6 mg/dL (ref 0.2–1.2)
BUN: 13 mg/dL (ref 7–25)
CALCIUM: 9.4 mg/dL (ref 8.6–10.4)
CO2: 28 mmol/L (ref 20–32)
Chloride: 102 mmol/L (ref 98–110)
Creat: 0.72 mg/dL (ref 0.50–0.99)
GLUCOSE: 94 mg/dL (ref 65–99)
Globulin: 2.3 g/dL (calc) (ref 1.9–3.7)
Potassium: 4.4 mmol/L (ref 3.5–5.3)
Sodium: 139 mmol/L (ref 135–146)
Total Protein: 6.6 g/dL (ref 6.1–8.1)

## 2018-08-29 LAB — VITAMIN D 25 HYDROXY (VIT D DEFICIENCY, FRACTURES): Vit D, 25-Hydroxy: 53 ng/mL (ref 30–100)

## 2018-08-29 LAB — LIPID PANEL
Cholesterol: 235 mg/dL — ABNORMAL HIGH (ref <200)
HDL: 79 mg/dL (ref >50)
LDL Cholesterol (Calc): 140 mg/dL (calc) — ABNORMAL HIGH
Non-HDL Cholesterol (Calc): 156 mg/dL (calc) — ABNORMAL HIGH (ref <130)
TRIGLYCERIDES: 65 mg/dL (ref <150)
Total CHOL/HDL Ratio: 3 (calc) (ref <5.0)

## 2018-08-30 LAB — URINALYSIS, COMPLETE W/RFL CULTURE
BILIRUBIN URINE: NEGATIVE
Bacteria, UA: NONE SEEN /HPF
GLUCOSE, UA: NEGATIVE
HGB URINE DIPSTICK: NEGATIVE
Hyaline Cast: NONE SEEN /LPF
LEUKOCYTE ESTERASE: NEGATIVE
NITRITES URINE, INITIAL: NEGATIVE
PH: 6.5 (ref 5.0–8.0)
Specific Gravity, Urine: 1.021 (ref 1.001–1.03)
Squamous Epithelial / LPF: NONE SEEN /HPF (ref <=5)
WBC UA: NONE SEEN /HPF (ref 0–5)

## 2018-08-30 LAB — URINE CULTURE
MICRO NUMBER:: 91391888
SPECIMEN QUALITY:: ADEQUATE

## 2018-08-30 LAB — CULTURE INDICATED

## 2018-08-31 ENCOUNTER — Encounter: Payer: Self-pay | Admitting: Internal Medicine

## 2018-08-31 ENCOUNTER — Ambulatory Visit (AMBULATORY_SURGERY_CENTER): Payer: Self-pay

## 2018-08-31 VITALS — Ht 65.5 in | Wt 121.4 lb

## 2018-08-31 DIAGNOSIS — Z1211 Encounter for screening for malignant neoplasm of colon: Secondary | ICD-10-CM

## 2018-08-31 NOTE — Progress Notes (Signed)
No egg or soy allergy known to patient  No issues with past sedation with any surgeries  or procedures, no intubation problems  No diet pills per patient No home 02 use per patient  No blood thinners per patient  Pt denies issues with constipation  No A fib or A flutter  EMMI video sent to pt's e mail. Yes

## 2018-09-11 ENCOUNTER — Ambulatory Visit (AMBULATORY_SURGERY_CENTER): Payer: 59 | Admitting: Internal Medicine

## 2018-09-11 ENCOUNTER — Encounter: Payer: Self-pay | Admitting: Internal Medicine

## 2018-09-11 VITALS — BP 105/55 | HR 66 | Temp 96.8°F | Resp 12 | Ht 65.0 in | Wt 119.0 lb

## 2018-09-11 DIAGNOSIS — D123 Benign neoplasm of transverse colon: Secondary | ICD-10-CM

## 2018-09-11 DIAGNOSIS — K635 Polyp of colon: Secondary | ICD-10-CM

## 2018-09-11 DIAGNOSIS — Z1211 Encounter for screening for malignant neoplasm of colon: Secondary | ICD-10-CM

## 2018-09-11 MED ORDER — SODIUM CHLORIDE 0.9 % IV SOLN: 500.0000 mL | Freq: Once | INTRAVENOUS | Status: DC

## 2018-09-11 NOTE — Op Note (Signed)
[facility] Patient Name: Krystal Collins Procedure Date: 09/11/2018 8:06 AM MRN: [id number] Endoscopist: Gatha Mayer , MD Age: 62 Referring MD:  Date of Birth: [birth year] Gender: Female Account #: [id number] Procedure:                Colonoscopy Indications:              Screening for colorectal malignant neoplasm, Last                            colonoscopy: 2007 Medicines:                Propofol per Anesthesia, Monitored Anesthesia Care Procedure:                Pre-Anesthesia Assessment:                           - Prior to the procedure, a History and Physical                            was performed, and patient medications and                            allergies were reviewed. The patient's tolerance of                            previous anesthesia was also reviewed. The risks                            and benefits of the procedure and the sedation                            options and risks were discussed with the patient.                            All questions were answered, and informed consent                            was obtained. Prior Anticoagulants: The patient has                            taken no previous anticoagulant or antiplatelet                            agents. ASA Grade Assessment: II - A patient with                            mild systemic disease. After reviewing the risks                            and benefits, the patient was deemed in                            satisfactory condition to undergo the procedure.  After obtaining informed consent, the colonoscope                            was passed under direct vision. Throughout the                            procedure, the patient's blood pressure, pulse, and                            oxygen saturations were monitored continuously. The                            Model CF-HQ190L [id number]) scope was introduced                            through the anus  and advanced to the the cecum,                            identified by appendiceal orifice and ileocecal                            valve. The colonoscopy was somewhat difficult due                            to significant looping. Successful completion of                            the procedure was aided by applying abdominal                            pressure. The patient tolerated the procedure well.                            The quality of the bowel preparation was excellent.                            The bowel preparation used was Miralax. The                            ileocecal valve, appendiceal orifice, and rectum                            were photographed. Scope In: 8:11:11 AM Scope Out: 8:30:41 AM Scope Withdrawal Time: 0 hours 11 minutes 34 seconds  Total Procedure Duration: 0 hours 19 minutes 30 seconds  Findings:                 The perianal and digital rectal examinations were                            normal.                           A 1 to 2 mm polyp was found in the transverse  colon. The polyp was sessile. The polyp was removed                            with a cold biopsy forceps. Resection and retrieval                            were complete. Verification of patient                            identification for the specimen was done. Estimated                            blood loss was minimal.                           External and internal hemorrhoids were found during                            retroflexion.                           The exam was otherwise without abnormality on                            direct and retroflexion views. Complications:            No immediate complications. Estimated Blood Loss:     Estimated blood loss was minimal. Impression:               - One 1 to 2 mm polyp in the transverse colon,                            removed with a cold biopsy forceps. Resected and                             retrieved.                           - External and internal hemorrhoids.                           - The examination was otherwise normal on direct                            and retroflexion views. Recommendation:           - Patient has a contact number available for                            emergencies. The signs and symptoms of potential                            delayed complications were discussed with the                            patient. Return to normal activities tomorrow.  Written discharge instructions were provided to the                            patient.                           - Resume previous diet.                           - Continue present medications.                           - Repeat colonoscopy is recommended. The                            colonoscopy date will be determined after pathology                            results from today's exam become available for                            review. Gatha Mayer, MD 09/11/2018 8:39:28 AM This report has been signed electronically.

## 2018-09-11 NOTE — Progress Notes (Signed)
Called to room to assist during endoscopic procedure.  Patient ID and intended procedure confirmed with present staff. Received instructions for my participation in the procedure from the performing physician.  

## 2018-09-11 NOTE — Progress Notes (Signed)
To recovery, report to RN

## 2018-09-11 NOTE — Progress Notes (Signed)
Pt's states no medical or surgical changes since previsit or office visit. 

## 2018-09-11 NOTE — Patient Instructions (Addendum)
   I found and removed one tiny polyp that looks benign.  I will let you know pathology results and when to have another routine colonoscopy by mail and/or My Chart.  I appreciate the opportunity to care for you. Gatha Mayer, MD, [facility]  Handouts given:  Polyps  YOU HAD AN ENDOSCOPIC PROCEDURE TODAY AT [facility]:   Refer to the procedure report that was given to you for any specific questions about what was found during the examination.  If the procedure report does not answer your questions, please call your gastroenterologist to clarify.  If you requested that your care partner not be given the details of your procedure findings, then the procedure report has been included in a sealed envelope for you to review at your convenience later.  YOU SHOULD EXPECT: Some feelings of bloating in the abdomen. Passage of more gas than usual.  Walking can help get rid of the air that was put into your GI tract during the procedure and reduce the bloating. If you had a lower endoscopy (such as a colonoscopy or flexible sigmoidoscopy) you may notice spotting of blood in your stool or on the toilet paper. If you underwent a bowel prep for your procedure, you may not have a normal bowel movement for a few days.  Please Note:  You might notice some irritation and congestion in your nose or some drainage.  This is from the oxygen used during your procedure.  There is no need for concern and it should clear up in a day or so.  SYMPTOMS TO REPORT IMMEDIATELY:   Following lower endoscopy (colonoscopy or flexible sigmoidoscopy):  Excessive amounts of blood in the stool  Significant tenderness or worsening of abdominal pains  Swelling of the abdomen that is new, acute  Fever of 100F or higher  For urgent or emergent issues, a gastroenterologist can be reached at any hour by calling [phone].   DIET:  We do recommend a small meal at first, but then you may proceed to your  regular diet.  Drink plenty of fluids but you should avoid alcoholic beverages for 24 hours.  ACTIVITY:  You should plan to take it easy for the rest of today and you should NOT DRIVE or use heavy machinery until tomorrow (because of the sedation medicines used during the test).    FOLLOW UP: Our staff will call the number listed on your records the next business day following your procedure to check on you and address any questions or concerns that you may have regarding the information given to you following your procedure. If we do not reach you, we will leave a message.  However, if you are feeling well and you are not experiencing any problems, there is no need to return our call.  We will assume that you have returned to your regular daily activities without incident.  If any biopsies were taken you will be contacted by phone or by letter within the next 1-3 weeks.  Please call us at [phone] if you have not heard about the biopsies in 3 weeks.    SIGNATURES/CONFIDENTIALITY: You and/or your care partner have signed paperwork which will be entered into your electronic medical record.  These signatures attest to the fact that that the information above on your After Visit Summary has been reviewed and is understood.  Full responsibility of the confidentiality of this discharge information lies with you and/or your care-partner.

## 2018-09-12 ENCOUNTER — Telehealth: Payer: Self-pay | Admitting: *Deleted

## 2018-09-12 NOTE — Telephone Encounter (Signed)
  Follow up Call-  Call back number 09/11/2018  Post procedure Call Back phone  # [phone]  Permission to leave phone message Yes  Some recent data might be hidden     Patient questions:  Do you have a fever, pain , or abdominal swelling? No. Pain Score  0 *  Have you tolerated food without any problems? Yes.    Have you been able to return to your normal activities? Yes.    Do you have any questions about your discharge instructions: Diet   No. Medications  No. Follow up visit  No.  Do you have questions or concerns about your Care? No.  Actions: * If pain score is 4 or above: No action needed, pain <4.

## 2018-09-19 ENCOUNTER — Encounter: Payer: Self-pay | Admitting: Internal Medicine

## 2018-09-19 DIAGNOSIS — Z860100 Personal history of colon polyps, unspecified: Secondary | ICD-10-CM | POA: Insufficient documentation

## 2018-09-19 HISTORY — DX: Personal history of colon polyps, unspecified: Z86.0100

## 2018-09-19 HISTORY — DX: Personal history of colonic polyps: Z86.010

## 2018-09-19 NOTE — Progress Notes (Signed)
[facility] ssp Recall 2024 My Chart

## 2019-05-28 ENCOUNTER — Other Ambulatory Visit: Payer: Self-pay | Admitting: Women's Health

## 2019-05-28 DIAGNOSIS — Z1231 Encounter for screening mammogram for malignant neoplasm of breast: Secondary | ICD-10-CM

## 2019-05-31 ENCOUNTER — Other Ambulatory Visit: Payer: Self-pay

## 2019-05-31 ENCOUNTER — Ambulatory Visit
Admission: RE | Admit: 2019-05-31 | Discharge: 2019-05-31 | Disposition: A | Discharge status: Home / Self Care | Payer: BC Managed Care – PPO | Source: Ambulatory Visit | Attending: Women's Health | Admitting: Women's Health

## 2019-05-31 DIAGNOSIS — Z1231 Encounter for screening mammogram for malignant neoplasm of breast: Secondary | ICD-10-CM

## 2019-06-01 ENCOUNTER — Encounter: Payer: Self-pay | Admitting: Women's Health

## 2019-08-13 ENCOUNTER — Ambulatory Visit (INDEPENDENT_AMBULATORY_CARE_PROVIDER_SITE_OTHER): Payer: PPO | Admitting: Family Medicine

## 2019-08-13 ENCOUNTER — Encounter: Payer: Self-pay | Admitting: Family Medicine

## 2019-08-13 ENCOUNTER — Other Ambulatory Visit: Payer: Self-pay

## 2019-08-13 VITALS — BP 122/78 | HR 73 | Temp 97.8°F | Ht 65.5 in | Wt 121.4 lb

## 2019-08-13 DIAGNOSIS — Z1159 Encounter for screening for other viral diseases: Secondary | ICD-10-CM

## 2019-08-13 DIAGNOSIS — E782 Mixed hyperlipidemia: Secondary | ICD-10-CM | POA: Diagnosis not present

## 2019-08-13 DIAGNOSIS — Z23 Encounter for immunization: Secondary | ICD-10-CM

## 2019-08-13 DIAGNOSIS — Z0000 Encounter for general adult medical examination without abnormal findings: Secondary | ICD-10-CM

## 2019-08-13 DIAGNOSIS — E785 Hyperlipidemia, unspecified: Secondary | ICD-10-CM | POA: Insufficient documentation

## 2019-08-13 DIAGNOSIS — Z114 Encounter for screening for human immunodeficiency virus [HIV]: Secondary | ICD-10-CM

## 2019-08-13 DIAGNOSIS — R5383 Other fatigue: Secondary | ICD-10-CM

## 2019-08-13 DIAGNOSIS — H21239 Degeneration of iris (pigmentary), unspecified eye: Secondary | ICD-10-CM | POA: Insufficient documentation

## 2019-08-13 DIAGNOSIS — M8580 Other specified disorders of bone density and structure, unspecified site: Secondary | ICD-10-CM

## 2019-08-13 LAB — COMPREHENSIVE METABOLIC PANEL
ALT: 14 U/L (ref 0–35)
AST: 17 U/L (ref 0–37)
Albumin: 4.4 g/dL (ref 3.5–5.2)
Alkaline Phosphatase: 41 U/L (ref 39–117)
BUN: 13 mg/dL (ref 6–23)
CO2: 27 mEq/L (ref 19–32)
Calcium: 9.5 mg/dL (ref 8.4–10.5)
Chloride: 103 mEq/L (ref 96–112)
Creatinine, Ser: 0.79 mg/dL (ref 0.40–1.20)
GFR: 73.03 mL/min (ref >60.00)
Glucose, Bld: 102 mg/dL — ABNORMAL HIGH (ref 70–99)
Potassium: 4.8 mEq/L (ref 3.5–5.1)
Sodium: 138 mEq/L (ref 135–145)
Total Bilirubin: 0.5 mg/dL (ref 0.2–1.2)
Total Protein: 6.7 g/dL (ref 6.0–8.3)

## 2019-08-13 LAB — CBC WITH DIFFERENTIAL/PLATELET
Basophils Absolute: 0 10*3/uL (ref 0.0–0.1)
Basophils Relative: 0.8 % (ref 0.0–3.0)
Eosinophils Absolute: 0.1 10*3/uL (ref 0.0–0.7)
Eosinophils Relative: 1.3 % (ref 0.0–5.0)
HCT: 40.8 % (ref 36.0–46.0)
Hemoglobin: 13.5 g/dL (ref 12.0–15.0)
Lymphocytes Relative: 26.9 % (ref 12.0–46.0)
Lymphs Abs: 1.1 10*3/uL (ref 0.7–4.0)
MCHC: 33.1 g/dL (ref 30.0–36.0)
MCV: 96.7 fl (ref 78.0–100.0)
Monocytes Absolute: 0.2 10*3/uL (ref 0.1–1.0)
Monocytes Relative: 5.2 % (ref 3.0–12.0)
Neutro Abs: 2.7 10*3/uL (ref 1.4–7.7)
Neutrophils Relative %: 65.8 % (ref 43.0–77.0)
Platelets: 170 10*3/uL (ref 150.0–400.0)
RBC: 4.22 Mil/uL (ref 3.87–5.11)
RDW: 12.6 % (ref 11.5–15.5)
WBC: 4.1 10*3/uL (ref 4.0–10.5)

## 2019-08-13 LAB — LIPID PANEL
Cholesterol: 217 mg/dL — ABNORMAL HIGH (ref 0–200)
HDL: 76.7 mg/dL (ref >39.00)
LDL Cholesterol: 130 mg/dL — ABNORMAL HIGH (ref 0–99)
NonHDL: 140.67
Total CHOL/HDL Ratio: 3
Triglycerides: 55 mg/dL (ref 0.0–149.0)
VLDL: 11 mg/dL (ref 0.0–40.0)

## 2019-08-13 LAB — TSH: TSH: 1.27 u[IU]/mL (ref 0.35–4.50)

## 2019-08-13 LAB — VITAMIN D 25 HYDROXY (VIT D DEFICIENCY, FRACTURES): VITD: 60.7 ng/mL (ref 30.00–100.00)

## 2019-08-13 NOTE — Progress Notes (Signed)
Phone: [phone]  Subjective:  Patient presents today for their Welcome to Medicare Exam    Preventive Screening-Counseling & Management  Vision screen: done in August 2020 No exam data present  Advanced directives: yes  Smoking Status: Never Smoker Second Hand Smoking status: No smokers in home  Risk Factors Regular exercise: yes-walks alot Diet: excellent-well balanced  Fall Risk: None  Fall Risk  08/13/2019  Falls in the past year? 0  Number falls in past yr: 0  Injury with Fall? 0   Opioid use history:  no long term opioids use  Cardiac risk factors:  advanced age (older than 88 for men, 51 for women) yes Hyperlipidemia: No diabetes. no Family History: father had a stroke.   Depression Screen None. PHQ2 0  Depression screen PHQ 2/9 08/13/2019  Decreased Interest 0  Down, Depressed, Hopeless 0  PHQ - 2 Score 0    Activities of Daily Living Independent ADLs and IADLs WNL  Hearing Difficulties: -patient declines  Cognitive Testing No reported trouble.  NONE  Normal 3 word recall  List the Names of Other Physician/Practitioners you currently use: -Dr Kathlen Mody for eyes -Dr. Denna Haggard for skin -nancy young: gynecology   Immunization History  Administered Date(s) Administered  . Influenza,inj,Quad PF,6+ Mos 05/21/2016, 08/28/2018  . Influenza-Unspecified 08/01/2019  . Tdap 08/01/2019   Required Immunizations needed today: pneumovax.   Screening tests- up to date Health Maintenance Due  Topic Date Due  . HIV Screening  08/04/1969  . PNA vac Low Risk Adult (1 of 2 - PCV13) 08/05/2019   Review of Systems  Constitutional: Negative for chills, fever and malaise/fatigue.  HENT: Negative for hearing loss and sore throat.   Eyes: Negative for blurred vision and double vision.  Respiratory: Negative for cough, shortness of breath and wheezing.   Cardiovascular: Negative for chest pain, palpitations and leg swelling.  Gastrointestinal: Negative  for abdominal pain, blood in stool, nausea and vomiting.  Genitourinary: Negative for dysuria and hematuria.  Musculoskeletal: Negative for falls.  Skin: Negative for rash.  Neurological: Negative for dizziness and weakness.  Psychiatric/Behavioral: Negative for memory loss and suicidal ideas. The patient is not nervous/anxious and does not have insomnia.      The following were reviewed and entered/updated in epic: Past Medical History:  Diagnosis Date  . Glaucoma   . Hx of sessile serrated colonic polyp 09/19/2018   Patient Active Problem List   Diagnosis Date Noted  . Pigmentary dispersion syndrome 08/13/2019  . Hyperlipidemia 08/13/2019  . Hx of sessile serrated colonic polyp 09/19/2018  . Osteopenia 08/18/2012   Past Surgical History:  Procedure Laterality Date  . ABDOMINAL HYSTERECTOMY    . COLON SURGERY      Family History  Problem Relation Age of Onset  . Hypertension Mother   . Kidney disease Mother        stage 5 dialysis  . Hypertension Father   . Stroke Father   . Osteoarthritis Maternal Grandmother   . Colon polyps Neg Hx   . Colon cancer Neg Hx   . Esophageal cancer Neg Hx   . Stomach cancer Neg Hx   . Rectal cancer Neg Hx     Medications- reviewed and updated Current Outpatient Medications  Medication Sig Dispense Refill  . Calcium-Magnesium-Vitamin D (CALCIUM 500 PO) Take by mouth.    . Cholecalciferol (VITAMIN D3) 2000 UNITS TABS Take by mouth.    . fish oil-omega-3 fatty acids 1000 MG capsule Take 2 g by mouth daily.    Marland Kitchen  LUTEIN-ZEAXANTHIN PO Take by mouth.    . Travoprost, BAK Free, (TRAVATAN) 0.004 % SOLN ophthalmic solution 1 drop at bedtime.    . TURMERIC PO Take by mouth.     No current facility-administered medications for this visit.     Allergies-reviewed and updated Allergies  Allergen Reactions  . Shellfish Allergy Hives    Social History   Socioeconomic History  . Marital status: Married    Spouse name: Not on file  .  Number of children: Not on file  . Years of education: Not on file  . Highest education level: Not on file  Occupational History  . Not on file  Social Needs  . Financial resource strain: Not on file  . Food insecurity    Worry: Not on file    Inability: Not on file  . Transportation needs    Medical: Not on file    Non-medical: Not on file  Tobacco Use  . Smoking status: Never Smoker  . Smokeless tobacco: Never Used  Substance and Sexual Activity  . Alcohol use: No  . Drug use: No  . Sexual activity: Yes    Birth control/protection: Surgical  Lifestyle  . Physical activity    Days per week: Not on file    Minutes per session: Not on file  . Stress: Not on file  Relationships  . Social Herbalist on phone: Not on file    Gets together: Not on file    Attends religious service: Not on file    Active member of club or organization: Not on file    Attends meetings of clubs or organizations: Not on file    Relationship status: Not on file  Other Topics Concern  . Not on file  Social History Narrative  . Not on file    Objective: BP 122/78 (BP Location: Left Arm, Patient Position: Sitting, Cuff Size: Normal)   Pulse 73   Temp 97.8 F (36.6 C) (Skin)   Ht 5' 5.5" (1.664 m)   Wt 121 lb 6.4 oz (55.1 kg)   SpO2 98%   BMI 19.89 kg/m  Gen: NAD, resting comfortably HEENT: Mucous membranes are moist. Oropharynx normal Neck: no thyromegaly CV: RRR no murmurs rubs or gallops Lungs: CTAB no crackles, wheeze, rhonchi Abdomen: soft/nontender/nondistended/normal bowel sounds. No rebound or guarding.  Ext: no edema Skin: warm, dry Neuro: grossly normal, moves all extremities, PERRLA  Assessment/Plan:  Welcome to Medicare exam completed- discussed recommended screenings anddocumented any personalized health advice and referrals for preventive counseling. See AVS as well which was given to patient.   Status of chronic or acute concerns  Depression screen [facility] 2/9  08/13/2019  Decreased Interest 0  Down, Depressed, Hopeless 0  PHQ - 2 Score 0   Fall Risk  08/13/2019  Falls in the past year? 0  Number falls in past yr: 0  Injury with Fall? 0     No problem-specific Assessment & Plan notes found for this encounter.   Future Appointments  Date Time Provider [place]  08/18/2020  8:00 AM Orma Flaming, MD [facility]   Return in about 1 year (around 08/12/2020) for annual/chol/labs .   Lab/Order associations: Osteopenia, unspecified location - Plan: CBC with Differential/Platelet, VITAMIN D 25 Hydroxy (Vit-D Deficiency, Fractures), DG Bone Density, CANCELED: Comprehensive metabolic panel  Welcome to Medicare preventive visit  Mixed hyperlipidemia - Plan: Comprehensive metabolic panel, Lipid panel  Other fatigue - Plan: TSH  Encounter for hepatitis C  screening test for low risk patient - Plan: Hepatitis C antibody  Encounter for screening for HIV - Plan: HIV Antibody (routine testing w rflx)  Need for vaccination for Strep pneumoniae - Plan: Pneumococcal polysaccharide vaccine 23-valent greater than or equal to 2yo subcutaneous/IM  No orders of the defined types were placed in this encounter.   Return precautions advised. Orma Flaming, MD

## 2019-08-13 NOTE — Patient Instructions (Addendum)
Your bone density shows you to be osteopenic. tIME to REPEAT this. I ordered for you. This means you have thinning bones and are at risk for osteoporosis. I would recommend that you do weight bearing activities to help increase your bone density and start calcium and vitamin D daily. Recommend 1200mg calcium and 800-1000IU/vitamin D daily.     Preventive Care 65 Years and Older, Female Preventive care refers to lifestyle choices and visits with your health care provider that can promote health and wellness. This includes:  A yearly physical exam. This is also called an annual well check.  Regular dental and eye exams.  Immunizations.  Screening for certain conditions.  Healthy lifestyle choices, such as diet and exercise. What can I expect for my preventive care visit? Physical exam Your health care provider will check:  Height and weight. These may be used to calculate body mass index (BMI), which is a measurement that tells if you are at a healthy weight.  Heart rate and blood pressure.  Your skin for abnormal spots. Counseling Your health care provider may ask you questions about:  Alcohol, tobacco, and drug use.  Emotional well-being.  Home and relationship well-being.  Sexual activity.  Eating habits.  History of falls.  Memory and ability to understand (cognition).  Work and work environment.  Pregnancy and menstrual history. What immunizations do I need?  Influenza (flu) vaccine  This is recommended every year. Tetanus, diphtheria, and pertussis (Tdap) vaccine  You may need a Td booster every 10 years. Varicella (chickenpox) vaccine  You may need this vaccine if you have not already been vaccinated. Zoster (shingles) vaccine  You may need this after age 60. Pneumococcal conjugate (PCV13) vaccine  One dose is recommended after age 65. Pneumococcal polysaccharide (PPSV23) vaccine  One dose is recommended after age 65. Measles, mumps, and rubella  (MMR) vaccine  You may need at least one dose of MMR if you were born in 1957 or later. You may also need a second dose. Meningococcal conjugate (MenACWY) vaccine  You may need this if you have certain conditions. Hepatitis A vaccine  You may need this if you have certain conditions or if you travel or work in places where you may be exposed to hepatitis A. Hepatitis B vaccine  You may need this if you have certain conditions or if you travel or work in places where you may be exposed to hepatitis B. Haemophilus influenzae type b (Hib) vaccine  You may need this if you have certain conditions. You may receive vaccines as individual doses or as more than one vaccine together in one shot (combination vaccines). Talk with your health care provider about the risks and benefits of combination vaccines. What tests do I need? Blood tests  Lipid and cholesterol levels. These may be checked every 5 years, or more frequently depending on your overall health.  Hepatitis C test.  Hepatitis B test. Screening  Lung cancer screening. You may have this screening every year starting at age 55 if you have a 30-pack-year history of smoking and currently smoke or have quit within the past 15 years.  Colorectal cancer screening. All adults should have this screening starting at age 50 and continuing until age 75. Your health care provider may recommend screening at age 45 if you are at increased risk. You will have tests every 1-10 years, depending on your results and the type of screening test.  Diabetes screening. This is done by checking your blood sugar (glucose)   after you have not eaten for a while (fasting). You may have this done every 1-3 years.  Mammogram. This may be done every 1-2 years. Talk with your health care provider about how often you should have regular mammograms.  BRCA-related cancer screening. This may be done if you have a family history of breast, ovarian, tubal, or peritoneal  cancers. Other tests  Sexually transmitted disease (STD) testing.  Bone density scan. This is done to screen for osteoporosis. You may have this done starting at age 65. Follow these instructions at home: Eating and drinking  Eat a diet that includes fresh fruits and vegetables, whole grains, lean protein, and low-fat dairy products. Limit your intake of foods with high amounts of sugar, saturated fats, and salt.  Take vitamin and mineral supplements as recommended by your health care provider.  Do not drink alcohol if your health care provider tells you not to drink.  If you drink alcohol: ? Limit how much you have to 0-1 drink a day. ? Be aware of how much alcohol is in your drink. In the [place] one drink equals one 12 oz bottle of beer (355 mL), one 5 oz glass of wine (148 mL), or one 1 oz glass of hard liquor (44 mL). Lifestyle  Take daily care of your teeth and gums.  Stay active. Exercise for at least 30 minutes on 5 or more days each week.  Do not use any products that contain nicotine or tobacco, such as cigarettes, e-cigarettes, and chewing tobacco. If you need help quitting, ask your health care provider.  If you are sexually active, practice safe sex. Use a condom or other form of protection in order to prevent STIs (sexually transmitted infections).  Talk with your health care provider about taking a low-dose aspirin or statin. What's next?  Go to your health care provider once a year for a well check visit.  Ask your health care provider how often you should have your eyes and teeth checked.  Stay up to date on all vaccines. This information is not intended to replace advice given to you by your health care provider. Make sure you discuss any questions you have with your health care provider. Document Released: 10/24/2015 Document Revised: 09/21/2018 Document Reviewed: 09/21/2018 Elsevier Patient Education  2020 Elsevier Inc[employer]    

## 2019-08-14 LAB — HEPATITIS C ANTIBODY
Hepatitis C Ab: NONREACTIVE
SIGNAL TO CUT-OFF: 0.01 (ref <1.00)

## 2019-08-14 LAB — HIV ANTIBODY (ROUTINE TESTING W REFLEX): HIV 1&2 Ab, 4th Generation: NONREACTIVE

## 2019-08-27 ENCOUNTER — Other Ambulatory Visit: Payer: Self-pay

## 2019-08-27 ENCOUNTER — Ambulatory Visit
Admission: RE | Admit: 2019-08-27 | Discharge: 2019-08-27 | Disposition: A | Discharge status: Home / Self Care | Payer: PPO | Source: Ambulatory Visit | Attending: Family Medicine | Admitting: Family Medicine

## 2019-08-27 DIAGNOSIS — Z780 Asymptomatic menopausal state: Secondary | ICD-10-CM | POA: Diagnosis not present

## 2019-08-27 DIAGNOSIS — M8588 Other specified disorders of bone density and structure, other site: Secondary | ICD-10-CM | POA: Diagnosis not present

## 2019-08-27 DIAGNOSIS — M810 Age-related osteoporosis without current pathological fracture: Secondary | ICD-10-CM | POA: Diagnosis not present

## 2019-08-27 DIAGNOSIS — M8580 Other specified disorders of bone density and structure, unspecified site: Secondary | ICD-10-CM

## 2019-08-30 ENCOUNTER — Other Ambulatory Visit: Payer: Self-pay | Admitting: Physician Assistant

## 2019-08-30 ENCOUNTER — Encounter: Payer: Self-pay | Admitting: Family Medicine

## 2019-08-30 DIAGNOSIS — D2239 Melanocytic nevi of other parts of face: Secondary | ICD-10-CM | POA: Diagnosis not present

## 2019-08-30 DIAGNOSIS — D229 Melanocytic nevi, unspecified: Secondary | ICD-10-CM | POA: Diagnosis not present

## 2019-08-30 DIAGNOSIS — D485 Neoplasm of uncertain behavior of skin: Secondary | ICD-10-CM | POA: Diagnosis not present

## 2019-11-20 DIAGNOSIS — H2513 Age-related nuclear cataract, bilateral: Secondary | ICD-10-CM | POA: Diagnosis not present

## 2019-11-20 DIAGNOSIS — H04123 Dry eye syndrome of bilateral lacrimal glands: Secondary | ICD-10-CM | POA: Diagnosis not present

## 2019-11-20 DIAGNOSIS — H401331 Pigmentary glaucoma, bilateral, mild stage: Secondary | ICD-10-CM | POA: Diagnosis not present

## 2019-11-20 DIAGNOSIS — H25013 Cortical age-related cataract, bilateral: Secondary | ICD-10-CM | POA: Diagnosis not present

## 2020-05-16 DIAGNOSIS — H00014 Hordeolum externum left upper eyelid: Secondary | ICD-10-CM | POA: Diagnosis not present

## 2020-05-16 DIAGNOSIS — H401331 Pigmentary glaucoma, bilateral, mild stage: Secondary | ICD-10-CM | POA: Diagnosis not present

## 2020-05-16 DIAGNOSIS — H00011 Hordeolum externum right upper eyelid: Secondary | ICD-10-CM | POA: Diagnosis not present

## 2020-06-27 DIAGNOSIS — H00014 Hordeolum externum left upper eyelid: Secondary | ICD-10-CM | POA: Diagnosis not present

## 2020-06-27 DIAGNOSIS — H00011 Hordeolum externum right upper eyelid: Secondary | ICD-10-CM | POA: Diagnosis not present

## 2020-06-27 DIAGNOSIS — H401331 Pigmentary glaucoma, bilateral, mild stage: Secondary | ICD-10-CM | POA: Diagnosis not present

## 2020-08-11 ENCOUNTER — Other Ambulatory Visit: Payer: Self-pay | Admitting: Family Medicine

## 2020-08-11 DIAGNOSIS — Z1231 Encounter for screening mammogram for malignant neoplasm of breast: Secondary | ICD-10-CM

## 2020-08-12 ENCOUNTER — Ambulatory Visit
Admission: RE | Admit: 2020-08-12 | Discharge: 2020-08-12 | Disposition: A | Discharge status: Home / Self Care | Payer: PPO | Source: Ambulatory Visit | Attending: Family Medicine | Admitting: Family Medicine

## 2020-08-12 ENCOUNTER — Other Ambulatory Visit: Payer: Self-pay

## 2020-08-12 DIAGNOSIS — Z1231 Encounter for screening mammogram for malignant neoplasm of breast: Secondary | ICD-10-CM

## 2020-08-18 ENCOUNTER — Ambulatory Visit (INDEPENDENT_AMBULATORY_CARE_PROVIDER_SITE_OTHER): Payer: PPO | Admitting: Family Medicine

## 2020-08-18 ENCOUNTER — Encounter: Payer: Self-pay | Admitting: Family Medicine

## 2020-08-18 ENCOUNTER — Other Ambulatory Visit: Payer: Self-pay

## 2020-08-18 VITALS — BP 120/80 | HR 68 | Temp 97.7°F | Ht 65.5 in | Wt 117.8 lb

## 2020-08-18 DIAGNOSIS — Z23 Encounter for immunization: Secondary | ICD-10-CM

## 2020-08-18 DIAGNOSIS — E782 Mixed hyperlipidemia: Secondary | ICD-10-CM | POA: Diagnosis not present

## 2020-08-18 DIAGNOSIS — M818 Other osteoporosis without current pathological fracture: Secondary | ICD-10-CM

## 2020-08-18 DIAGNOSIS — R739 Hyperglycemia, unspecified: Secondary | ICD-10-CM | POA: Diagnosis not present

## 2020-08-18 NOTE — Patient Instructions (Signed)
1) checking labs today 2) continue weight bearing exercises and your calcium and vitamin D  3) see you in a year! Will do bone scan next year.   Preventive Care 50 Years and Older, Female Preventive care refers to lifestyle choices and visits with your health care provider that can promote health and wellness. This includes:  A yearly physical exam. This is also calle    d an annual well check.  Regular dental and eye exams.  Immunizations.  Screening for certain conditions.  Healthy lifestyle choices, such as diet and exercise. What can I expect for my preventive care visit? Physical exam Your health care provider will check:  Height and weight. These may be used to calculate body mass index (BMI), which is a measurement that tells if you are at a healthy weight.  Heart rate and blood pressure.  Your skin for abnormal spots. Counseling Your health care provider may ask you questions about:  Alcohol, tobacco, and drug use.  Emotional well-being.  Home and relationship well-being.  Sexual activity.  Eating habits.  History of falls.  Memory and ability to understand (cognition).  Work and work Statistician.  Pregnancy and menstrual history. What immunizations do I need?  Influenza (flu) vaccine  This is recommended every year. Tetanus, diphtheria, and pertussis (Tdap) vaccine  You may need a Td booster every 10 years. Varicella (chickenpox) vaccine  You may need this vaccine if you have not already been vaccinated. Zoster (shingles) vaccine  You may need this after age 35. Pneumococcal conjugate (PCV13) vaccine  One dose is recommended after age 12. Pneumococcal polysaccharide (PPSV23) vaccine  One dose is recommended after age 15. Measles, mumps, and rubella (MMR) vaccine  You may need at least one dose of MMR if you were born in 1957 or later. You may also need a second dose. Meningococcal conjugate (MenACWY) vaccine  You may need this if  you have certain conditions. Hepatitis A vaccine  You may need this if you have certain conditions or if you travel or work in places where you may be exposed to hepatitis A. Hepatitis B vaccine  You may need this if you have certain conditions or if you travel or work in places where you may be exposed to hepatitis B. Haemophilus influenzae type b (Hib) vaccine  You may need this if you have certain conditions. You may receive vaccines as individual doses or as more than one vaccine together in one shot (combination vaccines). Talk with your health care provider about the risks and benefits of combination vaccines. What tests do I need? Blood tests  Lipid and cholesterol levels. These may be checked every 5 years, or more frequently depending on your overall health.  Hepatitis C test.  Hepatitis B test. Screening  Lung cancer screening. You may have this screening every year starting at age 64 if you have a 30-pack-year history of smoking and currently smoke or have quit within the past 15 years.  Colorectal cancer screening. All adults should have this screening starting at age 2 and continuing until age 10. Your health care provider may recommend screening at age 68 if you are at increased risk. You will have tests every 1-10 years, depending on your results and the type of screening test.  Diabetes screening. This is done by checking your blood sugar (glucose) after you have not eaten for a while (fasting). You may have this done every 1-3 years.  Mammogram. This may be done every 1-2 years. Talk  with your health care provider about how often you should have regular mammograms.  BRCA-related cancer screening. This may be done if you have a family history of breast, ovarian, tubal, or peritoneal cancers. Other tests  Sexually transmitted disease (STD) testing.  Bone density scan. This is done to screen for osteoporosis. You may have this done starting at age 77. Follow these  instructions at home: Eating and drinking  Eat a diet that includes fresh fruits and vegetables, whole grains, lean protein, and low-fat dairy products. Limit your intake of foods with high amounts of sugar, saturated fats, and salt.  Take vitamin and mineral supplements as recommended by your health care provider.  Do not drink alcohol if your health care provider tells you not to drink.  If you drink alcohol: ? Limit how much you have to 0-1 drink a day. ? Be aware of how much alcohol is in your drink. In the [place] one drink equals one 12 oz bottle of beer (355 mL), one 5 oz glass of wine (148 mL), or one 1 oz glass of hard liquor (44 mL). Lifestyle  Take daily care of your teeth and gums.  Stay active. Exercise for at least 30 minutes on 5 or more days each week.  Do not use any products that contain nicotine or tobacco, such as cigarettes, e-cigarettes, and chewing tobacco. If you need help quitting, ask your health care provider.  If you are sexually active, practice safe sex. Use a condom or other form of protection in order to prevent STIs (sexually transmitted infections).  Talk with your health care provider about taking a low-dose aspirin or statin. What's next?  Go to your health care provider once a year for a well check visit.  Ask your health care provider how often you should have your eyes and teeth checked.  Stay up to date on all vaccines. This information is not intended to replace advice given to you by your health care provider. Make sure you discuss any questions you have with your health care provider. Document Revised: 09/21/2018 Document Reviewed: 09/21/2018 Elsevier Patient Education  2020 [employer].

## 2020-08-18 NOTE — Progress Notes (Signed)
Patient: Krystal Collins MRN: [id number] DOB: 18-Mar-1956 PCP: Orma Flaming, MD     Subjective:  Chief Complaint  Patient presents with  . Annual Exam  . Hyperlipidemia  . Osteoporosis    HPI: The patient is a 64 y.o. female who presents today for an annual visit. She has no complaints. Exercising and eating very healthy. Needs flu shot today.  Hyperlipidemia She has dx of hyperlipidemia, but is diet controlled. Will check today. She is very healthy.   Osteoporosis Last DEXA was in 08/2019. T-score was -2.5.She is on calcium and vitamin D and very active with weight bearing exercises. Declined medication with her last DEXA. Will need repeat next year.   She has had her covid vaccines. HM UTD.  Sees derm yearly and optho q 6 months.   Review of Systems  Respiratory: Negative for chest tightness and shortness of breath.   Gastrointestinal: Negative for abdominal pain and nausea.  Neurological: Negative for light-headedness and headaches.    Allergies Patient is allergic to shellfish allergy.  Past Medical History Patient  has a past medical history of Glaucoma and Hx of sessile serrated colonic polyp (09/19/2018).  Surgical History Patient  has a past surgical history that includes Abdominal hysterectomy and Colon surgery.  Family History Pateint's family history includes Hypertension in her father and mother; Kidney disease in her mother; Osteoarthritis in her maternal grandmother; Stroke in her father.  Social History Patient  reports that she has never smoked. She has never used smokeless tobacco. She reports that she does not drink alcohol and does not use drugs.    Objective: Vitals:   08/18/20 0800  BP: 120/80  Pulse: 68  Temp: 97.7 F (36.5 C)  TempSrc: Temporal  SpO2: 98%  Weight: 117 lb 12.8 oz (53.4 kg)  Height: 5' 5.5" (1.664 m)    Body mass index is 19.3 kg/m.  Physical Exam Vitals reviewed.  Constitutional:      Appearance: Normal  appearance. She is well-developed and normal weight.  HENT:     Head: Normocephalic and atraumatic.     Right Ear: Tympanic membrane, ear canal and external ear normal.     Left Ear: Tympanic membrane, ear canal and external ear normal.     Mouth/Throat:     Mouth: Mucous membranes are moist.  Eyes:     Extraocular Movements: Extraocular movements intact.     Conjunctiva/sclera: Conjunctivae normal.     Pupils: Pupils are equal, round, and reactive to light.  Neck:     Thyroid: No thyromegaly.     Vascular: No carotid bruit.  Cardiovascular:     Rate and Rhythm: Normal rate and regular rhythm.     Pulses: Normal pulses.     Heart sounds: Normal heart sounds. No murmur heard.   Pulmonary:     Effort: Pulmonary effort is normal.     Breath sounds: Normal breath sounds.  Abdominal:     General: Abdomen is flat. Bowel sounds are normal. There is no distension.     Palpations: Abdomen is soft.     Tenderness: There is no abdominal tenderness.  Musculoskeletal:     Cervical back: Normal range of motion and neck supple.  Lymphadenopathy:     Cervical: No cervical adenopathy.  Skin:    General: Skin is warm and dry.     Capillary Refill: Capillary refill takes less than 2 seconds.     Findings: No rash.  Neurological:     General: No focal  deficit present.     Mental Status: She is alert and oriented to person, place, and time.     Cranial Nerves: No cranial nerve deficit.     Coordination: Coordination normal.     Deep Tendon Reflexes: Reflexes normal.  Psychiatric:        Mood and Affect: Mood normal.        Behavior: Behavior normal.      Office Visit from 08/18/2020 in [place]  PHQ-2 Total Score 0         Assessment/plan: 1. Mixed hyperlipidemia  - CBC with Differential/Platelet; Future - Lipid panel; Future - COMPLETE METABOLIC PANEL WITH GFR; Future  2. Other osteoporosis without current pathological fracture dexa next year. Continue  weight bearing exercises, calcium and vitamin D.   Flu shot today. HM reviewed and UTD.     This visit occurred during the SARS-CoV-2 public health emergency.  Safety protocols were in place, including screening questions prior to the visit, additional usage of staff PPE, and extensive cleaning of exam room while observing appropriate contact time as indicated for disinfecting solutions.     Return in about 1 year (around 08/18/2021) for chol/bone scan .   Orma Flaming, MD [place]   08/18/2020

## 2020-08-20 ENCOUNTER — Encounter: Payer: Self-pay | Admitting: Family Medicine

## 2020-08-20 DIAGNOSIS — R739 Hyperglycemia, unspecified: Secondary | ICD-10-CM

## 2020-08-23 LAB — CBC WITH DIFFERENTIAL/PLATELET
Absolute Monocytes: 244 cells/uL (ref 200–950)
Basophils Absolute: 42 cells/uL (ref 0–200)
Basophils Relative: 1 %
Eosinophils Absolute: 143 cells/uL (ref 15–500)
Eosinophils Relative: 3.4 %
HCT: 41.1 % (ref 35.0–45.0)
Hemoglobin: 13.5 g/dL (ref 11.7–15.5)
Lymphs Abs: 1285 cells/uL (ref 850–3900)
MCH: 31.7 pg (ref 27.0–33.0)
MCHC: 32.8 g/dL (ref 32.0–36.0)
MCV: 96.5 fL (ref 80.0–100.0)
MPV: 13.1 fL — ABNORMAL HIGH (ref 7.5–12.5)
Monocytes Relative: 5.8 %
Neutro Abs: 2486 cells/uL (ref 1500–7800)
Neutrophils Relative %: 59.2 %
Platelets: 150 10*3/uL (ref 140–400)
RBC: 4.26 10*6/uL (ref 3.80–5.10)
RDW: 11.5 % (ref 11.0–15.0)
Total Lymphocyte: 30.6 %
WBC: 4.2 10*3/uL (ref 3.8–10.8)

## 2020-08-23 LAB — HEMOGLOBIN A1C
Hgb A1c MFr Bld: 5.4 % of total Hgb (ref <5.7)
Mean Plasma Glucose: 108 (calc)
eAG (mmol/L): 6 (calc)

## 2020-08-23 LAB — LIPID PANEL
Cholesterol: 203 mg/dL — ABNORMAL HIGH (ref <200)
HDL: 73 mg/dL (ref >=50)
LDL Cholesterol (Calc): 113 mg/dL (calc) — ABNORMAL HIGH
Non-HDL Cholesterol (Calc): 130 mg/dL (calc) — ABNORMAL HIGH (ref <130)
Total CHOL/HDL Ratio: 2.8 (calc) (ref <5.0)
Triglycerides: 80 mg/dL (ref <150)

## 2020-08-23 LAB — TEST AUTHORIZATION

## 2020-08-23 LAB — COMPLETE METABOLIC PANEL WITH GFR
AG Ratio: 1.6 (calc) (ref 1.0–2.5)
ALT: 9 U/L (ref 6–29)
AST: 16 U/L (ref 10–35)
Albumin: 4.1 g/dL (ref 3.6–5.1)
Alkaline phosphatase (APISO): 40 U/L (ref 37–153)
BUN: 10 mg/dL (ref 7–25)
CO2: 27 mmol/L (ref 20–32)
Calcium: 9.5 mg/dL (ref 8.6–10.4)
Chloride: 102 mmol/L (ref 98–110)
Creat: 0.76 mg/dL (ref 0.50–0.99)
GFR, Est African American: 95 mL/min/{1.73_m2} (ref >=60)
GFR, Est Non African American: 82 mL/min/{1.73_m2} (ref >=60)
Globulin: 2.5 g/dL (calc) (ref 1.9–3.7)
Glucose, Bld: 103 mg/dL — ABNORMAL HIGH (ref 65–99)
Potassium: 4.8 mmol/L (ref 3.5–5.3)
Sodium: 135 mmol/L (ref 135–146)
Total Bilirubin: 0.6 mg/dL (ref 0.2–1.2)
Total Protein: 6.6 g/dL (ref 6.1–8.1)

## 2020-09-12 ENCOUNTER — Encounter: Payer: Self-pay | Admitting: Family Medicine

## 2020-10-11 HISTORY — PX: CATARACT EXTRACTION: SUR2

## 2020-11-10 ENCOUNTER — Telehealth: Payer: Self-pay | Admitting: Family Medicine

## 2020-11-10 NOTE — Telephone Encounter (Signed)
NO ANSWER/VM FULL. Pt due to schedule Medicare Annual Wellness Visit (AWV) either virtually OR in office.   Welcome Exam 08/13/19; please schedule at anytime with [facility] at [facility].  This should be a 45 minute visit.

## 2020-11-28 DIAGNOSIS — H2513 Age-related nuclear cataract, bilateral: Secondary | ICD-10-CM | POA: Diagnosis not present

## 2020-11-28 DIAGNOSIS — H401331 Pigmentary glaucoma, bilateral, mild stage: Secondary | ICD-10-CM | POA: Diagnosis not present

## 2020-11-28 DIAGNOSIS — H0102A Squamous blepharitis right eye, upper and lower eyelids: Secondary | ICD-10-CM | POA: Diagnosis not present

## 2020-11-28 DIAGNOSIS — H5213 Myopia, bilateral: Secondary | ICD-10-CM | POA: Diagnosis not present

## 2020-11-28 DIAGNOSIS — H25013 Cortical age-related cataract, bilateral: Secondary | ICD-10-CM | POA: Diagnosis not present

## 2020-12-25 DIAGNOSIS — H401331 Pigmentary glaucoma, bilateral, mild stage: Secondary | ICD-10-CM | POA: Diagnosis not present

## 2020-12-25 DIAGNOSIS — H25013 Cortical age-related cataract, bilateral: Secondary | ICD-10-CM | POA: Diagnosis not present

## 2020-12-25 DIAGNOSIS — H2512 Age-related nuclear cataract, left eye: Secondary | ICD-10-CM | POA: Diagnosis not present

## 2020-12-25 DIAGNOSIS — H2513 Age-related nuclear cataract, bilateral: Secondary | ICD-10-CM | POA: Diagnosis not present

## 2021-01-07 DIAGNOSIS — H25812 Combined forms of age-related cataract, left eye: Secondary | ICD-10-CM | POA: Diagnosis not present

## 2021-01-07 DIAGNOSIS — H2512 Age-related nuclear cataract, left eye: Secondary | ICD-10-CM | POA: Diagnosis not present

## 2021-05-13 ENCOUNTER — Other Ambulatory Visit: Payer: Self-pay

## 2021-05-14 ENCOUNTER — Encounter: Payer: Self-pay | Admitting: Internal Medicine

## 2021-05-14 ENCOUNTER — Ambulatory Visit (INDEPENDENT_AMBULATORY_CARE_PROVIDER_SITE_OTHER): Payer: PPO | Admitting: Internal Medicine

## 2021-05-14 VITALS — BP 110/70 | HR 64 | Temp 98.2°F | Ht 65.3 in | Wt 116.1 lb

## 2021-05-14 DIAGNOSIS — Z860100 Personal history of colon polyps, unspecified: Secondary | ICD-10-CM

## 2021-05-14 DIAGNOSIS — E782 Mixed hyperlipidemia: Secondary | ICD-10-CM | POA: Diagnosis not present

## 2021-05-14 DIAGNOSIS — H409 Unspecified glaucoma: Secondary | ICD-10-CM

## 2021-05-14 DIAGNOSIS — M818 Other osteoporosis without current pathological fracture: Secondary | ICD-10-CM

## 2021-05-14 DIAGNOSIS — H21239 Degeneration of iris (pigmentary), unspecified eye: Secondary | ICD-10-CM | POA: Diagnosis not present

## 2021-05-14 NOTE — Progress Notes (Signed)
Established Patient Office Visit     This visit occurred during the SARS-CoV-2 public health emergency.  Safety protocols were in place, including screening questions prior to the visit, additional usage of staff PPE, and extensive cleaning of exam room while observing appropriate contact time as indicated for disinfecting solutions.    CC/Reason for Visit: Establish care, discuss chronic medical conditions  HPI: Krystal Collins is a 66 y.o. female who is coming in today for the above mentioned reasons. Past Medical History is significant for: Hyperlipidemia for which she takes omega-3 fatty acids, osteoporosis, not on bisphosphonate therapy due for repeat DEXA scan this year.  She also has a history of glaucoma and a pigment disorder in her eye followed by ophthalmology.  During her colonoscopy in 2019 she was found to have a sessile serrated polyp and was advised to have repeat colonoscopy in 5 years.  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  She has 2 grown children and 3 grandchildren.  She is an avid gardener.  She is a never smoker, she does not drink, she has no known drug allergies.  Her family history significant for mother with hypertension and chronic kidney disease and a father with hypertension and CVA.  She is due for a conjugated pneumonia vaccine and her COVID boosters and declines both today.  She is overdue for a Pap smear.  She has no acute concerns or complaints today.   Past Medical/Surgical History: Past Medical History:  Diagnosis Date   Glaucoma    Hx of sessile serrated colonic polyp 09/19/2018    Past Surgical History:  Procedure Laterality Date   ABDOMINAL HYSTERECTOMY     COLON SURGERY      Social History:  reports that she has never smoked. She has never used smokeless tobacco. She reports that she does not drink alcohol and does not use drugs.  Allergies: Allergies  Allergen Reactions   Shellfish Allergy Hives     Family History:  Family History  Problem Relation Age of Onset   Hypertension Mother    Kidney disease Mother        stage 5 dialysis   Hypertension Father    Stroke Father    Osteoarthritis Maternal Grandmother    Colon polyps Neg Hx    Colon cancer Neg Hx    Esophageal cancer Neg Hx    Stomach cancer Neg Hx    Rectal cancer Neg Hx      Current Outpatient Medications:    CALCIUM PO, Take 1,000 mg by mouth 3 (three) times daily., Disp: , Rfl:    Cholecalciferol (VITAMIN D3) 2000 UNITS TABS, Take by mouth., Disp: , Rfl:    fish oil-omega-3 fatty acids 1000 MG capsule, Take 2 g by mouth daily., Disp: , Rfl:    LUTEIN-ZEAXANTHIN PO, Take by mouth., Disp: , Rfl:   Review of Systems:  Constitutional: Denies fever, chills, diaphoresis, appetite change and fatigue.  HEENT: Denies photophobia, eye pain, redness, hearing loss, ear pain, congestion, sore throat, rhinorrhea, sneezing, mouth sores, trouble swallowing, neck pain, neck stiffness and tinnitus.   Respiratory: Denies SOB, DOE, cough, chest tightness,  and wheezing.   Cardiovascular: Denies chest pain, palpitations and leg swelling.  Gastrointestinal: Denies nausea, vomiting, abdominal pain, diarrhea, constipation, blood in stool and abdominal distention.  Genitourinary: Denies dysuria, urgency, frequency, hematuria, flank pain and difficulty urinating.  Endocrine: Denies: hot or cold intolerance, sweats, changes in  hair or nails, polyuria, polydipsia. Musculoskeletal: Denies myalgias, back pain, joint swelling, arthralgias and gait problem.  Skin: Denies pallor, rash and wound.  Neurological: Denies dizziness, seizures, syncope, weakness, light-headedness, numbness and headaches.  Hematological: Denies adenopathy. Easy bruising, personal or family bleeding history  Psychiatric/Behavioral: Denies suicidal ideation, mood changes, confusion, nervousness, sleep disturbance and agitation    Physical Exam: Vitals:   05/14/21  0802  BP: 110/70  Pulse: 64  Temp: 98.2 F (36.8 C)  TempSrc: Oral  SpO2: 97%  Weight: 116 lb 1.6 oz (52.7 kg)  Height: 5' 5.3" (1.659 m)    Body mass index is 19.14 kg/m.   Constitutional: NAD, calm, comfortable Eyes: PERRL, lids and conjunctivae normal, wears corrective lenses ENMT: Mucous membranes are moist.  Respiratory: clear to auscultation bilaterally, no wheezing, no crackles. Normal respiratory effort. No accessory muscle use.  Cardiovascular: Regular rate and rhythm, no murmurs / rubs / gallops. No extremity edema.  Neurologic: Grossly intact and nonfocal Psychiatric: Normal judgment and insight. Alert and oriented x 3. Normal mood.    Impression and Plan:  Mixed hyperlipidemia  Other osteoporosis without current pathological fracture  Hx of sessile serrated colonic polyp  Glaucoma, unspecified glaucoma type, unspecified laterality  Pigment dispersion syndrome, unspecified laterality  -She will return in April for her physical/wellness visit.  Time spent: 22 minutes reviewing chart, interviewing and examining patient and formulating plan for follow-up.     Lelon Frohlich, MD [facility] at [facility]

## 2021-05-21 ENCOUNTER — Other Ambulatory Visit: Payer: Self-pay

## 2021-05-21 ENCOUNTER — Encounter: Payer: Self-pay | Admitting: Physician Assistant

## 2021-05-21 ENCOUNTER — Ambulatory Visit: Payer: PPO | Admitting: Physician Assistant

## 2021-05-21 DIAGNOSIS — Z86018 Personal history of other benign neoplasm: Secondary | ICD-10-CM | POA: Diagnosis not present

## 2021-05-21 DIAGNOSIS — Z1283 Encounter for screening for malignant neoplasm of skin: Secondary | ICD-10-CM | POA: Diagnosis not present

## 2021-05-21 DIAGNOSIS — D485 Neoplasm of uncertain behavior of skin: Secondary | ICD-10-CM | POA: Diagnosis not present

## 2021-05-21 DIAGNOSIS — L989 Disorder of the skin and subcutaneous tissue, unspecified: Secondary | ICD-10-CM | POA: Diagnosis not present

## 2021-05-21 NOTE — Patient Instructions (Signed)

## 2021-05-26 ENCOUNTER — Encounter: Payer: Self-pay | Admitting: Physician Assistant

## 2021-05-26 NOTE — Progress Notes (Signed)
   Follow-Up Visit   Subjective  Krystal Collins is a 65 y.o. female who presents for the following: Annual Exam (Patient here today for yearly skin check, no concerns. Personal history of atypical moles. No personal history of non mole skin cancer. No family history of atypical moles, melanoma or non mole skin cancer. ).   The following portions of the chart were reviewed this encounter and updated as appropriate:  Tobacco  Allergies  Meds  Problems  Med Hx  Surg Hx  Fam Hx      Objective  Well appearing patient in no apparent distress; mood and affect are within normal limits.  A full examination was performed including scalp, head, eyes, ears, nose, lips, neck, chest, axillae, abdomen, back, buttocks, bilateral upper extremities, bilateral lower extremities, hands, feet, fingers, toes, fingernails, and toenails. All findings within normal limits unless otherwise noted below.  Left Shoulder - Anterior Bichromic dark nested macule.         Assessment & Plan  Neoplasm of uncertain behavior of skin Left Shoulder - Anterior  Skin / nail biopsy Type of biopsy: tangential   Informed consent: discussed and consent obtained   Timeout: patient name, date of birth, surgical site, and procedure verified   Procedure prep:  Patient was prepped and draped in usual sterile fashion (Non sterile) Prep type:  Chlorhexidine Anesthesia: the lesion was anesthetized in a standard fashion   Anesthetic:  1% lidocaine w/ epinephrine 1-100,000 local infiltration Instrument used: flexible razor blade   Hemostasis achieved with: aluminum chloride   Outcome: patient tolerated procedure well   Post-procedure details: sterile dressing applied and wound care instructions given   Dressing type: bandage and petrolatum    Specimen 1 - Surgical pathology Differential Diagnosis: R/O Atypia  Check Margins: Yes    I, Esme Freund, PA-C, have reviewed all documentation's for this visit.  The  documentation on 05/26/21 for the exam, diagnosis, procedures and orders are all accurate and complete.

## 2021-06-03 ENCOUNTER — Telehealth: Payer: Self-pay | Admitting: *Deleted

## 2021-06-03 NOTE — Telephone Encounter (Signed)
Pathology to patient- excision/treatment scheduled.

## 2021-06-03 NOTE — Telephone Encounter (Signed)
-----   Message from Warren Danes, [place] sent at 06/03/2021  8:17 AM EDT ----- excise

## 2021-06-09 DIAGNOSIS — H401331 Pigmentary glaucoma, bilateral, mild stage: Secondary | ICD-10-CM | POA: Diagnosis not present

## 2021-07-17 ENCOUNTER — Other Ambulatory Visit: Payer: Self-pay | Admitting: Internal Medicine

## 2021-07-17 DIAGNOSIS — Z1231 Encounter for screening mammogram for malignant neoplasm of breast: Secondary | ICD-10-CM

## 2021-08-19 ENCOUNTER — Ambulatory Visit
Admission: RE | Admit: 2021-08-19 | Discharge: 2021-08-19 | Disposition: A | Discharge status: Home / Self Care | Payer: PPO | Source: Ambulatory Visit | Attending: Internal Medicine | Admitting: Internal Medicine

## 2021-08-19 ENCOUNTER — Other Ambulatory Visit: Payer: Self-pay

## 2021-08-19 DIAGNOSIS — Z1231 Encounter for screening mammogram for malignant neoplasm of breast: Secondary | ICD-10-CM | POA: Diagnosis not present

## 2021-09-11 ENCOUNTER — Encounter: Payer: Self-pay | Admitting: Internal Medicine

## 2021-09-11 ENCOUNTER — Ambulatory Visit (INDEPENDENT_AMBULATORY_CARE_PROVIDER_SITE_OTHER): Payer: PPO | Admitting: Internal Medicine

## 2021-09-11 VITALS — BP 120/80 | HR 64 | Temp 97.6°F | Ht 64.5 in | Wt 117.9 lb

## 2021-09-11 DIAGNOSIS — Z0000 Encounter for general adult medical examination without abnormal findings: Secondary | ICD-10-CM

## 2021-09-11 DIAGNOSIS — Z860100 Personal history of colon polyps, unspecified: Secondary | ICD-10-CM

## 2021-09-11 DIAGNOSIS — Z23 Encounter for immunization: Secondary | ICD-10-CM | POA: Diagnosis not present

## 2021-09-11 DIAGNOSIS — H21239 Degeneration of iris (pigmentary), unspecified eye: Secondary | ICD-10-CM | POA: Diagnosis not present

## 2021-09-11 DIAGNOSIS — M818 Other osteoporosis without current pathological fracture: Secondary | ICD-10-CM | POA: Diagnosis not present

## 2021-09-11 DIAGNOSIS — E782 Mixed hyperlipidemia: Secondary | ICD-10-CM

## 2021-09-11 LAB — COMPREHENSIVE METABOLIC PANEL
ALT: 15 U/L (ref 0–35)
AST: 18 U/L (ref 0–37)
Albumin: 4.2 g/dL (ref 3.5–5.2)
Alkaline Phosphatase: 40 U/L (ref 39–117)
BUN: 18 mg/dL (ref 6–23)
CO2: 28 mEq/L (ref 19–32)
Calcium: 9.5 mg/dL (ref 8.4–10.5)
Chloride: 102 mEq/L (ref 96–112)
Creatinine, Ser: 0.75 mg/dL (ref 0.40–1.20)
GFR: 82.56 mL/min (ref >60.00)
Glucose, Bld: 93 mg/dL (ref 70–99)
Potassium: 3.7 mEq/L (ref 3.5–5.1)
Sodium: 137 mEq/L (ref 135–145)
Total Bilirubin: 0.7 mg/dL (ref 0.2–1.2)
Total Protein: 6.9 g/dL (ref 6.0–8.3)

## 2021-09-11 LAB — CBC WITH DIFFERENTIAL/PLATELET
Basophils Absolute: 0 10*3/uL (ref 0.0–0.1)
Basophils Relative: 0.8 % (ref 0.0–3.0)
Eosinophils Absolute: 0.1 10*3/uL (ref 0.0–0.7)
Eosinophils Relative: 2.6 % (ref 0.0–5.0)
HCT: 42.2 % (ref 36.0–46.0)
Hemoglobin: 13.8 g/dL (ref 12.0–15.0)
Lymphocytes Relative: 31.5 % (ref 12.0–46.0)
Lymphs Abs: 1.1 10*3/uL (ref 0.7–4.0)
MCHC: 32.8 g/dL (ref 30.0–36.0)
MCV: 97.8 fl (ref 78.0–100.0)
Monocytes Absolute: 0.2 10*3/uL (ref 0.1–1.0)
Monocytes Relative: 6.8 % (ref 3.0–12.0)
Neutro Abs: 2.1 10*3/uL (ref 1.4–7.7)
Neutrophils Relative %: 58.3 % (ref 43.0–77.0)
Platelets: 131 10*3/uL — ABNORMAL LOW (ref 150.0–400.0)
RBC: 4.31 Mil/uL (ref 3.87–5.11)
RDW: 12.4 % (ref 11.5–15.5)
WBC: 3.6 10*3/uL — ABNORMAL LOW (ref 4.0–10.5)

## 2021-09-11 LAB — LIPID PANEL
Cholesterol: 221 mg/dL — ABNORMAL HIGH (ref 0–200)
HDL: 78.3 mg/dL (ref >39.00)
LDL Cholesterol: 129 mg/dL — ABNORMAL HIGH (ref 0–99)
NonHDL: 143.17
Total CHOL/HDL Ratio: 3
Triglycerides: 70 mg/dL (ref 0.0–149.0)
VLDL: 14 mg/dL (ref 0.0–40.0)

## 2021-09-11 LAB — TSH: TSH: 1.68 u[IU]/mL (ref 0.35–5.50)

## 2021-09-11 LAB — VITAMIN D 25 HYDROXY (VIT D DEFICIENCY, FRACTURES): VITD: 45.78 ng/mL (ref 30.00–100.00)

## 2021-09-11 LAB — HEMOGLOBIN A1C: Hgb A1c MFr Bld: 5.7 % (ref 4.6–6.5)

## 2021-09-11 LAB — VITAMIN B12: Vitamin B-12: 339 pg/mL (ref 211–911)
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
## 2021-09-11 NOTE — Addendum Note (Signed)
Addended by: Westley Hummer B on: 09/11/2021 01:39 PM   Modules accepted: Orders

## 2021-09-11 NOTE — Patient Instructions (Signed)
-  Nice seeing you today!!  -Lab work today; will notify you once results are available.  -Pneumonia vaccine today, consider getting your COVID booster at the pharmacy.  -Schedule follow-up in 1 year or sooner as needed.

## 2021-09-23 ENCOUNTER — Other Ambulatory Visit: Payer: Self-pay

## 2021-09-23 ENCOUNTER — Ambulatory Visit (INDEPENDENT_AMBULATORY_CARE_PROVIDER_SITE_OTHER): Payer: PPO | Admitting: Physician Assistant

## 2021-09-23 ENCOUNTER — Encounter: Payer: Self-pay | Admitting: Physician Assistant

## 2021-09-23 DIAGNOSIS — L988 Other specified disorders of the skin and subcutaneous tissue: Secondary | ICD-10-CM | POA: Diagnosis not present

## 2021-09-23 DIAGNOSIS — D229 Melanocytic nevi, unspecified: Secondary | ICD-10-CM

## 2021-09-23 DIAGNOSIS — D2262 Melanocytic nevi of left upper limb, including shoulder: Secondary | ICD-10-CM

## 2021-09-23 NOTE — Progress Notes (Signed)
° °  Follow-Up Visit   Subjective  Krystal Collins is a 65 y.o. female who presents for the following: Procedure (Here for treatment. Left anterior shoulder - atypical junctional lentiginous melanocytic proliferation, irritated, close to margin).   The following portions of the chart were reviewed this encounter and updated as appropriate:  Tobacco   Allergies   Meds   Problems   Med Hx   Surg Hx   Fam Hx       Objective  Well appearing patient in no apparent distress; mood and affect are within normal limits.  A focused examination was performed including left shoulder. Relevant physical exam findings are noted in the Assessment and Plan.  Left Shoulder - Anterior Small pink macule   Assessment & Plan  Atypical mole Left Shoulder - Anterior  Skin excision - Left Shoulder - Anterior  Margin per side (cm):  0.3 Total excision diameter (cm):  4 Informed consent: discussed and consent obtained   Timeout: patient name, date of birth, surgical site, and procedure verified   Anesthesia: the lesion was anesthetized in a standard fashion   Anesthetic:  1% lidocaine w/ epinephrine 1-100,000 local infiltration Instrument used: #15 blade   Hemostasis achieved with: pressure and electrodesiccation   Outcome: patient tolerated procedure well with no complications   Post-procedure details: sterile dressing applied and wound care instructions given   Dressing type: bandage, petrolatum and pressure dressing   Additional details:  4-0 vicryl x 4 4-0 nylon x 5  Specimen 1 - Surgical pathology Differential Diagnosis: superior margin stain  Check Margins: YES    I, Jabori Henegar, PA-C, have reviewed all documentation's for this visit.  The documentation on 09/23/21 for the exam, diagnosis, procedures and orders are all accurate and complete.

## 2021-09-23 NOTE — Patient Instructions (Signed)

## 2021-10-07 ENCOUNTER — Ambulatory Visit (INDEPENDENT_AMBULATORY_CARE_PROVIDER_SITE_OTHER): Payer: PPO | Admitting: *Deleted

## 2021-10-07 ENCOUNTER — Other Ambulatory Visit: Payer: Self-pay

## 2021-10-07 DIAGNOSIS — Z4802 Encounter for removal of sutures: Secondary | ICD-10-CM

## 2021-10-07 NOTE — Progress Notes (Signed)
Here for suture removal. Removed x 4 sutures. No signs or symptoms of infection.  Follow up 3 months.

## 2021-10-09 DIAGNOSIS — R300 Dysuria: Secondary | ICD-10-CM | POA: Diagnosis not present

## 2021-12-10 DIAGNOSIS — H401331 Pigmentary glaucoma, bilateral, mild stage: Secondary | ICD-10-CM | POA: Diagnosis not present

## 2021-12-10 DIAGNOSIS — H25811 Combined forms of age-related cataract, right eye: Secondary | ICD-10-CM | POA: Diagnosis not present

## 2021-12-10 DIAGNOSIS — H04123 Dry eye syndrome of bilateral lacrimal glands: Secondary | ICD-10-CM | POA: Diagnosis not present

## 2021-12-10 DIAGNOSIS — H5213 Myopia, bilateral: Secondary | ICD-10-CM | POA: Diagnosis not present

## 2021-12-10 DIAGNOSIS — Z961 Presence of intraocular lens: Secondary | ICD-10-CM | POA: Diagnosis not present

## 2021-12-23 ENCOUNTER — Encounter: Payer: Self-pay | Admitting: Physician Assistant

## 2021-12-23 ENCOUNTER — Ambulatory Visit: Payer: PPO | Admitting: Physician Assistant

## 2021-12-23 ENCOUNTER — Other Ambulatory Visit: Payer: Self-pay

## 2021-12-23 DIAGNOSIS — B078 Other viral warts: Secondary | ICD-10-CM | POA: Diagnosis not present

## 2021-12-23 DIAGNOSIS — Z86018 Personal history of other benign neoplasm: Secondary | ICD-10-CM | POA: Diagnosis not present

## 2021-12-23 DIAGNOSIS — S80862A Insect bite (nonvenomous), left lower leg, initial encounter: Secondary | ICD-10-CM

## 2021-12-23 DIAGNOSIS — L570 Actinic keratosis: Secondary | ICD-10-CM | POA: Diagnosis not present

## 2021-12-23 DIAGNOSIS — W57XXXA Bitten or stung by nonvenomous insect and other nonvenomous arthropods, initial encounter: Secondary | ICD-10-CM

## 2021-12-23 MED ORDER — TRIAMCINOLONE ACETONIDE 0.1 % EX CREA: 1.0000 "application " | TOPICAL_CREAM | Freq: Every day | CUTANEOUS | 2 refills | Status: DC

## 2021-12-23 NOTE — Progress Notes (Signed)
? ?  Follow-Up Visit ?  ?Subjective  ?Krystal Collins is a 66 y.o. female who presents for the following: Follow-up (Here for 3 month skin check exam. No new concerns today. History of atypical moles. ). Scar from atypical melanocytic proliferation healed well. She had a lesion on the front of her left shin that just popped up about 2 weeks ago. It became raised and very sore and dark. She had no fever, vomiting or nausea. No targetoid lesion.  ? ? ?The following portions of the chart were reviewed this encounter and updated as appropriate:  Tobacco  Allergies  Meds  Problems  Med Hx  Surg Hx  Fam Hx   ?  ? ?Objective  ?Well appearing patient in no apparent distress; mood and affect are within normal limits. ? ?A full examination was performed including scalp, head, eyes, ears, nose, lips, neck, chest, axillae, abdomen, back, buttocks, bilateral upper extremities, bilateral lower extremities, hands, feet, fingers, toes, fingernails, and toenails. All findings within normal limits unless otherwise noted below. ? ?Left Lower Leg - Anterior ?Imbedded dead tick with surrounding inflammatory reaction. ? ? ? ? ? ? ?Right Inner Eyebrow ?Verrucous papules -- Discussed viral etiology and contagion.  ? ?Left Mid Helix ?Erythematous patches with gritty scale. ? ? ?Assessment & Plan  ?Insect bite of left lower leg with local reaction, initial encounter ?Left Lower Leg - Anterior ? ?Removed the tick. Identified under the microscope as a deer tick. Call if any changes as it has been embedded for over a week.  ? ?triamcinolone cream (KENALOG) 0.1 % - Left Lower Leg - Anterior ?Apply 1 application. topically daily. ? ?Other viral warts ?Right Inner Eyebrow ? ?Destruction of lesion - Right Inner Eyebrow ?Complexity: simple   ?Destruction method: cryotherapy   ?Informed consent: discussed and consent obtained   ?Timeout:  patient name, date of birth, surgical site, and procedure verified ?Lesion destroyed using liquid nitrogen:  Yes   ?Cryotherapy cycles:  1 ?Outcome: patient tolerated procedure well with no complications   ?Post-procedure details: wound care instructions given   ? ?AK (actinic keratosis) ?Left Mid Helix ? ?Destruction of lesion - Left Mid Helix ?Complexity: simple   ?Destruction method: cryotherapy   ?Informed consent: discussed and consent obtained   ?Timeout:  patient name, date of birth, surgical site, and procedure verified ?Lesion destroyed using liquid nitrogen: Yes   ?Cryotherapy cycles:  1 ?Outcome: patient tolerated procedure well with no complications   ?Post-procedure details: wound care instructions given   ? ?No atypical nevi noted at the time of the visit. ? ?I, Lee-Anne Flicker, PA-C, have reviewed all documentation's for this visit.  The documentation on 12/23/21 for the exam, diagnosis, procedures and orders are all accurate and complete. ?

## 2021-12-23 NOTE — Patient Instructions (Signed)
Seborrheic Keratosis A seborrheic keratosis is a common, noncancerous (benign) skin growth. These growths are velvety, waxy, rough, tan, brown, or black spots that appear on the skin. These skin growths can be flat or raised, andscaly. What are the causes? The cause of this condition is not known. What increases the risk? You are more likely to develop this condition if you: Have a family history of seborrheic keratosis. Are 50 or older. Are pregnant. Have had estrogen replacement therapy. What are the signs or symptoms? Symptoms of this condition include growths on the face, chest, shoulders, back, or other areas. These growths: Are usually painless, but may become irritated and itchy. Can be yellow, brown, black, or other colors. Are slightly raised or have a flat surface. Are sometimes rough or wart-like in texture. Are often velvety or waxy on the surface. Are round or oval-shaped. Often occur in groups, but may occur as a single growth. How is this diagnosed? This condition is diagnosed with a medical history and physical exam. A sample of the growth may be tested (skin biopsy). You may need to see a skin specialist (dermatologist). How is this treated? Treatment is not usually needed for this condition, unless the growths are irritated or bleed often. You may also choose to have the growths removed if you do not like their appearance. Most commonly, these growths are treated with a procedure in which liquid nitrogen is applied to "freeze" off the growth (cryosurgery). They may also be burned off with electricity (electrocautery) or removed by scraping (curettage). Follow these instructions at home: Watch your growth for any changes. Keep all follow-up visits as told by your health care provider. This is important. Do not scratch or pick at the growth or growths. This can cause them to become irritated or infected. Contact a health care provider if: You suddenly have many new  growths. Your growth bleeds, itches, or hurts. Your growth suddenly becomes larger or changes color. Summary A seborrheic keratosis is a common, noncancerous (benign) skin growth. Treatment is not usually needed for this condition, unless the growths are irritated or bleed often. Watch your growth for any changes. Contact a health care provider if you suddenly have many new growths or your growth suddenly becomes larger or changes color. Keep all follow-up visits as told by your health care provider. This is important. This information is not intended to replace advice given to you by your health care provider. Make sure you discuss any questions you have with your healthcare provider. Document Revised: 02/09/2018 Document Reviewed: 02/09/2018 Elsevier Patient Education  2022 Elsevier Inc.  

## 2022-02-28 DIAGNOSIS — N3000 Acute cystitis without hematuria: Secondary | ICD-10-CM | POA: Diagnosis not present

## 2022-05-25 ENCOUNTER — Ambulatory Visit: Payer: PPO | Admitting: Physician Assistant

## 2022-06-16 ENCOUNTER — Ambulatory Visit: Payer: PPO | Admitting: Physician Assistant

## 2022-07-27 ENCOUNTER — Other Ambulatory Visit: Payer: Self-pay | Admitting: Internal Medicine

## 2022-07-27 DIAGNOSIS — Z1231 Encounter for screening mammogram for malignant neoplasm of breast: Secondary | ICD-10-CM

## 2022-09-13 ENCOUNTER — Ambulatory Visit
Admission: RE | Admit: 2022-09-13 | Discharge: 2022-09-13 | Disposition: A | Discharge status: Home / Self Care | Payer: PPO | Source: Ambulatory Visit | Attending: Internal Medicine | Admitting: Internal Medicine

## 2022-09-13 DIAGNOSIS — Z1231 Encounter for screening mammogram for malignant neoplasm of breast: Secondary | ICD-10-CM

## 2022-10-25 ENCOUNTER — Other Ambulatory Visit: Payer: Self-pay | Admitting: *Deleted

## 2022-10-25 ENCOUNTER — Ambulatory Visit (INDEPENDENT_AMBULATORY_CARE_PROVIDER_SITE_OTHER): Payer: Medicare Other | Admitting: Internal Medicine

## 2022-10-25 ENCOUNTER — Encounter: Payer: Self-pay | Admitting: Internal Medicine

## 2022-10-25 VITALS — BP 120/80 | HR 70 | Temp 97.6°F | Ht 65.5 in | Wt 117.8 lb

## 2022-10-25 DIAGNOSIS — E782 Mixed hyperlipidemia: Secondary | ICD-10-CM

## 2022-10-25 DIAGNOSIS — Z0000 Encounter for general adult medical examination without abnormal findings: Secondary | ICD-10-CM

## 2022-10-25 DIAGNOSIS — Z13820 Encounter for screening for osteoporosis: Secondary | ICD-10-CM | POA: Diagnosis not present

## 2022-10-25 DIAGNOSIS — Z86010 Personal history of colonic polyps: Secondary | ICD-10-CM

## 2022-10-25 DIAGNOSIS — H21239 Degeneration of iris (pigmentary), unspecified eye: Secondary | ICD-10-CM

## 2022-10-25 LAB — CBC WITH DIFFERENTIAL/PLATELET
Basophils Absolute: 0 10*3/uL (ref 0.0–0.1)
Basophils Relative: 1 % (ref 0.0–3.0)
Eosinophils Absolute: 0.1 10*3/uL (ref 0.0–0.7)
Eosinophils Relative: 2.5 % (ref 0.0–5.0)
HCT: 41.2 % (ref 36.0–46.0)
Hemoglobin: 13.7 g/dL (ref 12.0–15.0)
Lymphocytes Relative: 34.7 % (ref 12.0–46.0)
Lymphs Abs: 1.5 10*3/uL (ref 0.7–4.0)
MCHC: 33.3 g/dL (ref 30.0–36.0)
MCV: 96.5 fl (ref 78.0–100.0)
Monocytes Absolute: 0.3 10*3/uL (ref 0.1–1.0)
Monocytes Relative: 7.4 % (ref 3.0–12.0)
Neutro Abs: 2.3 10*3/uL (ref 1.4–7.7)
Neutrophils Relative %: 54.4 % (ref 43.0–77.0)
Platelets: 161 10*3/uL (ref 150.0–400.0)
RBC: 4.27 Mil/uL (ref 3.87–5.11)
RDW: 12.9 % (ref 11.5–15.5)
WBC: 4.3 10*3/uL (ref 4.0–10.5)

## 2022-10-25 LAB — HEMOGLOBIN A1C: Hgb A1c MFr Bld: 5.8 % (ref 4.6–6.5)

## 2022-10-25 LAB — COMPREHENSIVE METABOLIC PANEL
ALT: 14 U/L (ref 0–35)
AST: 18 U/L (ref 0–37)
Albumin: 4.2 g/dL (ref 3.5–5.2)
Alkaline Phosphatase: 35 U/L — ABNORMAL LOW (ref 39–117)
BUN: 14 mg/dL (ref 6–23)
CO2: 26 mEq/L (ref 19–32)
Calcium: 9.3 mg/dL (ref 8.4–10.5)
Chloride: 102 mEq/L (ref 96–112)
Creatinine, Ser: 0.75 mg/dL (ref 0.40–1.20)
GFR: 81.92 mL/min (ref >60.00)
Glucose, Bld: 98 mg/dL (ref 70–99)
Potassium: 4.3 mEq/L (ref 3.5–5.1)
Sodium: 136 mEq/L (ref 135–145)
Total Bilirubin: 0.4 mg/dL (ref 0.2–1.2)
Total Protein: 6.7 g/dL (ref 6.0–8.3)

## 2022-10-25 LAB — LIPID PANEL
Cholesterol: 247 mg/dL — ABNORMAL HIGH (ref 0–200)
HDL: 77.7 mg/dL (ref >39.00)
LDL Cholesterol: 157 mg/dL — ABNORMAL HIGH (ref 0–99)
NonHDL: 169.16
Total CHOL/HDL Ratio: 3
Triglycerides: 62 mg/dL (ref 0.0–149.0)
VLDL: 12.4 mg/dL (ref 0.0–40.0)

## 2022-10-25 LAB — TSH: TSH: 2 u[IU]/mL (ref 0.35–5.50)

## 2022-10-25 LAB — VITAMIN D 25 HYDROXY (VIT D DEFICIENCY, FRACTURES): VITD: 53.93 ng/mL (ref 30.00–100.00)

## 2022-10-25 LAB — VITAMIN B12: Vitamin B-12: 352 pg/mL (ref 211–911)

## 2022-10-25 MED ORDER — ATORVASTATIN CALCIUM 10 MG PO TABS: 10.0000 mg | ORAL_TABLET | Freq: Every day | ORAL | 3 refills | Status: DC

## 2022-10-25 NOTE — Progress Notes (Signed)
Established Patient Office Visit     CC/Reason for Visit: Annual preventive exam and subsequent Medicare wellness visit  HPI: Krystal Collins is a 67 y.o. female who is coming in today for the above mentioned reasons. Past Medical History is significant for: Hyperlipidemia not on medication, osteoporosis, colon polyps with colonoscopies every 5 years due this December, glaucoma.  She is feeling well and has no acute concerns or complaints.  She has routine eye and dental care, no perceived hearing difficulty.  She exercises by walking a couple miles every day.  She is overdue for COVID and RSV vaccines.  She is overdue for DEXA scan, she sees dermatology routinely, she will be seeing a new gynecologist in February.  Her last colonoscopy was in December 2019.   Past Medical/Surgical History: Past Medical History:  Diagnosis Date   Atypical mole 02/26/2004   Left Mid Abdomen (mild)   Atypical mole 02/26/2004   Right Low Abdomen (mild)   Atypical mole 02/26/2004   Upper Left Arm (mild)   Atypical mole 02/26/2004   Right Low Back (mild)   Atypical mole 02/28/2006   Left Low Back (moderate)   Atypical mole 02/28/2006   Mid Low Back (moderate to severe) (excision)   Atypical mole 05/25/2010   Right Low Back (mild to moderate)   Atypical mole 05/25/2010   Left Upper Medial Arm (mild)   Atypical mole 05/25/2010   Right Upper Abdomen (mild to moderate)   Atypical mole 05/25/2010   Mid Upper Abdomen (moderate to severe) (excision)   Atypical mole 05/28/2011   Right Chest (moderate to severe) (punch)   Atypical mole 01/28/2012   Left Lower Leg (moderate)   Atypical mole 01/28/2012   Left Inner Thigh (moderate)   Atypical mole 09/05/2013   Right Outer Upper Ant. Thigh (proliferation) (widershave)   Atypical mole 09/05/2013   Right Outer Upper Post. Thigh (proliferation) (widershave)   Atypical mole 09/28/2013   Right Chest (moderate)   Atypical mole 03/29/2014   Left Outer  Upper Back (mild)   Atypical mole 03/29/2014   Left Axilla (mild)   Atypical mole 03/29/2014   Right Upper Outer Arm (mild)   Atypical mole 10/02/2014   Right Thumb (severe) (widershave)   Atypical mole 10/02/2014   Left Post Shoulder (moderate) (widershave)   Atypical mole 10/02/2014   Left Foot (mild) (widershave)   Atypical mole 10/02/2014   Right Shoulder (severe) (widershave)   Atypical mole 11/12/2016   Right Calf (proliferation)   Glaucoma    Hx of sessile serrated colonic polyp 09/19/2018    Past Surgical History:  Procedure Laterality Date   ABDOMINAL HYSTERECTOMY     COLON SURGERY      Social History:  reports that she has never smoked. She has never used smokeless tobacco. She reports that she does not drink alcohol and does not use drugs.  Allergies: Allergies  Allergen Reactions   Shellfish Allergy Hives    Family History:  Family History  Problem Relation Age of Onset   Hypertension Mother    Kidney disease Mother        stage 5 dialysis   Hypertension Father    Stroke Father    Osteoarthritis Maternal Grandmother    Colon polyps Neg Hx    Colon cancer Neg Hx    Esophageal cancer Neg Hx    Stomach cancer Neg Hx    Rectal cancer Neg Hx      Current Outpatient Medications:  CALCIUM PO, Take 1,000 mg by mouth 3 (three) times daily., Disp: , Rfl:    Cholecalciferol (VITAMIN D3) 2000 UNITS TABS, Take by mouth., Disp: , Rfl:    dorzolamide-timolol (COSOPT) 22.3-6.8 MG/ML ophthalmic solution, , Disp: , Rfl:    fish oil-omega-3 fatty acids 1000 MG capsule, Take 2 g by mouth daily., Disp: , Rfl:    Menaquinone-7 (K2 PO), Take 1,000 mg by mouth., Disp: , Rfl:    OVER THE COUNTER MEDICATION, Turmeric 750 mg once daily, Disp: , Rfl:    OVER THE COUNTER MEDICATION, Magnesium gly. 240 mg 2 per day, Disp: , Rfl:   Review of Systems:  Constitutional: Denies fever, chills, diaphoresis, appetite change and fatigue.  HEENT: Denies photophobia, eye pain,  redness, hearing loss, ear pain, congestion, sore throat, rhinorrhea, sneezing, mouth sores, trouble swallowing, neck pain, neck stiffness and tinnitus.   Respiratory: Denies SOB, DOE, cough, chest tightness,  and wheezing.   Cardiovascular: Denies chest pain, palpitations and leg swelling.  Gastrointestinal: Denies nausea, vomiting, abdominal pain, diarrhea, constipation, blood in stool and abdominal distention.  Genitourinary: Denies dysuria, urgency, frequency, hematuria, flank pain and difficulty urinating.  Endocrine: Denies: hot or cold intolerance, sweats, changes in hair or nails, polyuria, polydipsia. Musculoskeletal: Denies myalgias, back pain, joint swelling, arthralgias and gait problem.  Skin: Denies pallor, rash and wound.  Neurological: Denies dizziness, seizures, syncope, weakness, light-headedness, numbness and headaches.  Hematological: Denies adenopathy. Easy bruising, personal or family bleeding history  Psychiatric/Behavioral: Denies suicidal ideation, mood changes, confusion, nervousness, sleep disturbance and agitation    Physical Exam: Vitals:   10/25/22 0727  BP: 120/80  Pulse: 70  Temp: 97.6 F (36.4 C)  TempSrc: Oral  SpO2: 99%  Weight: 117 lb 12.8 oz (53.4 kg)  Height: 5' 5.5" (1.664 m)    Body mass index is 19.3 kg/m.   Constitutional: NAD, calm, comfortable Eyes: PERRL, lids and conjunctivae normal, wears corrective lenses ENMT: Mucous membranes are moist. Posterior pharynx clear of any exudate or lesions. Normal dentition. Tympanic membrane is pearly white, no erythema or bulging. Neck: normal, supple, no masses, no thyromegaly Respiratory: clear to auscultation bilaterally, no wheezing, no crackles. Normal respiratory effort. No accessory muscle use.  Cardiovascular: Regular rate and rhythm, no murmurs / rubs / gallops. No extremity edema. 2+ pedal pulses. No carotid bruits.  Abdomen: no tenderness, no masses palpated. No hepatosplenomegaly. Bowel  sounds positive.  Musculoskeletal: no clubbing / cyanosis. No joint deformity upper and lower extremities. Good ROM, no contractures. Normal muscle tone.  Skin: no rashes, lesions, ulcers. No induration Neurologic: CN 2-12 grossly intact. Sensation intact, DTR normal. Strength 5/5 in all 4.  Psychiatric: Normal judgment and insight. Alert and oriented x 3. Normal mood.   Subsequent Medicare wellness visit   1. Risk factors, based on past  M,S,F -cardiovascular disease risk factors include age, history of hyperlipidemia   2.  Physical activities: Very active, walks daily   3.  Depression/mood: Stable, not depressed   4.  Hearing: No perceived issues   5.  ADL's: Independent in all ADLs   6.  Fall risk: Low fall risk   7.  Home safety: No problems identified   8.  Height weight, and visual acuity: height and weight as above, vision:  Vision Screening   Right eye Left eye Both eyes  Without correction '20/25 20/20 20/20 '$  With correction        9.  Counseling: Advised to update age-appropriate immunizations,   10. Lab  orders based on risk factors: Laboratory update will be reviewed   11. Referral : DEXA   12. Care plan: Follow-up with me in 1 year   13. Cognitive assessment: No cognitive impairment   14. Screening: Patient provided with a written and personalized 5-10 year screening schedule in the AVS. yes   15. Provider List Update: PCP, ophthalmologist  16. Advance Directives: Full code   17. Opioids: Patient is not on any opioid prescriptions and has no risk factors for a substance use disorder.   [place] Office Visit from 10/25/2022 in [place] at [place]  PHQ-9 Total Score 0          08/13/2019    8:13 AM 08/18/2020    8:03 AM 05/14/2021    8:01 AM 09/11/2021    8:03 AM 10/25/2022    7:34 AM  Fall Risk  Falls in the past year? 0 0 0 0 0  Was there an injury with Fall? 0  0 0 0  Fall Risk Category Calculator 0  0 0 0  Fall Risk Category  Low  Low Low Low  Patient Fall Risk Level Low fall risk      Patient at Risk for Falls Due to  No Fall Risks   No Fall Risks  Fall risk Follow up     Falls evaluation completed      Impression and Plan:  Encounter for preventive health examination  Screening for osteoporosis - Plan: DG Bone Density  Mixed hyperlipidemia - Plan: CBC with Differential/Platelet, Comprehensive metabolic panel, Hemoglobin A1c, Lipid panel, TSH, Vitamin B12, VITAMIN D 25 Hydroxy (Vit-D Deficiency, Fractures)  Hx of sessile serrated colonic polyp  Pigment dispersion syndrome, unspecified laterality  -Recommend routine eye and dental care. -Immunizations: Advised to update RSV and COVID, other immunizations are up-to-date. -Healthy lifestyle discussed in detail. -Labs to be updated today. -Colon cancer screening: 09/2018, 5-year follow-up, due in December. -Breast cancer screening: 09/2022 -Cervical cancer screening: Will be seeing GYN, Dr. Sharen Counter in February -Lung cancer screening: Not applicable -Prostate cancer screening: Not applicable -DEXA: Overdue, requested      Lelon Frohlich, MD [facility] at [facility]

## 2022-10-29 NOTE — Progress Notes (Deleted)
67 y.o. G37P2002 Married Caucasian female here for NEW GYN/annual exam.    PCP:     No LMP recorded. Patient has had a hysterectomy.           Sexually active: {yes no:314532}  The current method of family planning is status post hysterectomy.    Exercising: {yes no:314532}  {types:19826} Smoker:  {YES NO:22349}  Health Maintenance: Pap:  01/25/11 neg History of abnormal Pap:  {YES NO:22349} MMG:  09/13/22 Breast Density Category B, BI-RADS CATEGORY 1 Neg Colonoscopy:  09/11/18 BMD:   08/27/19  Result  osteoporotic TDaP:  08/01/19 Gardasil:   no HIV: Hep C: Screening Labs:  Hb today: ***, Urine today: ***   reports that she has never smoked. She has never used smokeless tobacco. She reports that she does not drink alcohol and does not use drugs.  Past Medical History:  Diagnosis Date   Atypical mole 02/26/2004   Left Mid Abdomen (mild)   Atypical mole 02/26/2004   Right Low Abdomen (mild)   Atypical mole 02/26/2004   Upper Left Arm (mild)   Atypical mole 02/26/2004   Right Low Back (mild)   Atypical mole 02/28/2006   Left Low Back (moderate)   Atypical mole 02/28/2006   Mid Low Back (moderate to severe) (excision)   Atypical mole 05/25/2010   Right Low Back (mild to moderate)   Atypical mole 05/25/2010   Left Upper Medial Arm (mild)   Atypical mole 05/25/2010   Right Upper Abdomen (mild to moderate)   Atypical mole 05/25/2010   Mid Upper Abdomen (moderate to severe) (excision)   Atypical mole 05/28/2011   Right Chest (moderate to severe) (punch)   Atypical mole 01/28/2012   Left Lower Leg (moderate)   Atypical mole 01/28/2012   Left Inner Thigh (moderate)   Atypical mole 09/05/2013   Right Outer Upper Ant. Thigh (proliferation) (widershave)   Atypical mole 09/05/2013   Right Outer Upper Post. Thigh (proliferation) (widershave)   Atypical mole 09/28/2013   Right Chest (moderate)   Atypical mole 03/29/2014   Left Outer Upper Back (mild)   Atypical mole  03/29/2014   Left Axilla (mild)   Atypical mole 03/29/2014   Right Upper Outer Arm (mild)   Atypical mole 10/02/2014   Right Thumb (severe) (widershave)   Atypical mole 10/02/2014   Left Post Shoulder (moderate) (widershave)   Atypical mole 10/02/2014   Left Foot (mild) (widershave)   Atypical mole 10/02/2014   Right Shoulder (severe) (widershave)   Atypical mole 11/12/2016   Right Calf (proliferation)   Glaucoma    Hx of sessile serrated colonic polyp 09/19/2018    Past Surgical History:  Procedure Laterality Date   ABDOMINAL HYSTERECTOMY     COLON SURGERY      Current Outpatient Medications  Medication Sig Dispense Refill   atorvastatin (LIPITOR) 10 MG tablet Take 1 tablet (10 mg total) by mouth daily. 90 tablet 3   CALCIUM PO Take 1,000 mg by mouth 3 (three) times daily.     Cholecalciferol (VITAMIN D3) 2000 UNITS TABS Take by mouth.     dorzolamide-timolol (COSOPT) 22.3-6.8 MG/ML ophthalmic solution      fish oil-omega-3 fatty acids 1000 MG capsule Take 2 g by mouth daily.     Menaquinone-7 (K2 PO) Take 1,000 mg by mouth.     OVER THE COUNTER MEDICATION Turmeric 750 mg once daily     OVER THE COUNTER MEDICATION Magnesium gly. 240 mg 2 per day  No current facility-administered medications for this visit.    Family History  Problem Relation Age of Onset   Hypertension Mother    Kidney disease Mother        stage 5 dialysis   Hypertension Father    Stroke Father    Osteoarthritis Maternal Grandmother    Colon polyps Neg Hx    Colon cancer Neg Hx    Esophageal cancer Neg Hx    Stomach cancer Neg Hx    Rectal cancer Neg Hx     Review of Systems  Exam:   There were no vitals taken for this visit.    General appearance: alert, cooperative and appears stated age Head: normocephalic, without obvious abnormality, atraumatic Neck: no adenopathy, supple, symmetrical, trachea midline and thyroid normal to inspection and palpation Lungs: clear to auscultation  bilaterally Breasts: normal appearance, no masses or tenderness, No nipple retraction or dimpling, No nipple discharge or bleeding, No axillary adenopathy Heart: regular rate and rhythm Abdomen: soft, non-tender; no masses, no organomegaly Extremities: extremities normal, atraumatic, no cyanosis or edema Skin: skin color, texture, turgor normal. No rashes or lesions Lymph nodes: cervical, supraclavicular, and axillary nodes normal. Neurologic: grossly normal  Pelvic: External genitalia:  no lesions              No abnormal inguinal nodes palpated.              Urethra:  normal appearing urethra with no masses, tenderness or lesions              Bartholins and Skenes: normal                 Vagina: normal appearing vagina with normal color and discharge, no lesions              Cervix: no lesions              Pap taken: {yes no:314532} Bimanual Exam:  Uterus:  normal size, contour, position, consistency, mobility, non-tender              Adnexa: no mass, fullness, tenderness              Rectal exam: {yes no:314532}.  Confirms.              Anus:  normal sphincter tone, no lesions  Chaperone was present for exam:  ***  Assessment:   Well woman visit with gynecologic exam.   Plan: Mammogram screening discussed. Self breast awareness reviewed. Pap and HR HPV as above. Guidelines for Calcium, Vitamin D, regular exercise program including cardiovascular and weight bearing exercise.   Follow up annually and prn.   Additional counseling given.  {yes [id number]. _______ minutes face to face time of which over 50% was spent in counseling.    After visit summary provided.

## 2022-11-12 ENCOUNTER — Encounter: Payer: PPO | Admitting: Obstetrics and Gynecology

## 2023-04-08 ENCOUNTER — Ambulatory Visit
Admission: RE | Admit: 2023-04-08 | Discharge: 2023-04-08 | Disposition: A | Discharge status: Home / Self Care | Payer: Medicare Other | Source: Ambulatory Visit | Attending: Internal Medicine | Admitting: Internal Medicine

## 2023-04-08 DIAGNOSIS — Z13820 Encounter for screening for osteoporosis: Secondary | ICD-10-CM

## 2023-08-10 ENCOUNTER — Encounter: Payer: Self-pay | Admitting: Internal Medicine

## 2023-09-16 ENCOUNTER — Telehealth: Payer: Self-pay | Admitting: Internal Medicine

## 2023-09-16 NOTE — Telephone Encounter (Signed)
Spoke with patient to schedule AWV She stated she has changed pcp she goes to dr Deneise Lever now

## 2023-09-30 ENCOUNTER — Telehealth: Payer: Self-pay | Admitting: Internal Medicine

## 2023-09-30 ENCOUNTER — Ambulatory Visit (AMBULATORY_SURGERY_CENTER): Payer: Medicare Other

## 2023-09-30 VITALS — Ht 65.0 in | Wt 114.0 lb

## 2023-09-30 DIAGNOSIS — D121 Benign neoplasm of appendix: Secondary | ICD-10-CM

## 2023-09-30 DIAGNOSIS — Z1211 Encounter for screening for malignant neoplasm of colon: Secondary | ICD-10-CM

## 2023-09-30 MED ORDER — NA SULFATE-K SULFATE-MG SULF 17.5-3.13-1.6 GM/177ML PO SOLN: 1.0000 | Freq: Once | ORAL | 0 refills | Status: AC

## 2023-09-30 MED ORDER — NA SULFATE-K SULFATE-MG SULF 17.5-3.13-1.6 GM/177ML PO SOLN: 1.0000 | Freq: Once | ORAL | 0 refills | Status: DC

## 2023-09-30 NOTE — Telephone Encounter (Signed)
Sent new prescription to [facility] on [address].  Spoke with patient to let her know it had been sent to [facility]

## 2023-09-30 NOTE — Telephone Encounter (Signed)
Patient called and stated that the [facility] in [place] is charger her to much for her prep medication and she would now like for it to sent over to [facility] on [address] in [place]. Please advise.

## 2023-09-30 NOTE — Addendum Note (Signed)
Addended by: Jaquelyn Bitter on: 09/30/2023 04:51 PM   Modules accepted: Orders

## 2023-09-30 NOTE — Progress Notes (Signed)

## 2023-10-20 NOTE — Progress Notes (Signed)
 [facility] History and Physical   Primary Care Physician:  Jesus Elberta Gainer, FNP   Reason for Procedure:  History of sessile serrated colon polyp  Plan:    Colonoscopy     HPI: Krystal Collins is a 68 y.o. female status post resection of a diminutive sessile serrated colon polyp in 2019 who presents for surveillance colonoscopy.   Past Medical History:  Diagnosis Date   Atypical mole 02/26/2004   Left Mid Abdomen (mild)   Atypical mole 02/26/2004   Right Low Abdomen (mild)   Atypical mole 02/26/2004   Upper Left Arm (mild)   Atypical mole 02/26/2004   Right Low Back (mild)   Atypical mole 02/28/2006   Left Low Back (moderate)   Atypical mole 02/28/2006   Mid Low Back (moderate to severe) (excision)   Atypical mole 05/25/2010   Right Low Back (mild to moderate)   Atypical mole 05/25/2010   Left Upper Medial Arm (mild)   Atypical mole 05/25/2010   Right Upper Abdomen (mild to moderate)   Atypical mole 05/25/2010   Mid Upper Abdomen (moderate to severe) (excision)   Atypical mole 05/28/2011   Right Chest (moderate to severe) (punch)   Atypical mole 01/28/2012   Left Lower Leg (moderate)   Atypical mole 01/28/2012   Left Inner Thigh (moderate)   Atypical mole 09/05/2013   Right Outer Upper Ant. Thigh (proliferation) (widershave)   Atypical mole 09/05/2013   Right Outer Upper Post. Thigh (proliferation) (widershave)   Atypical mole 09/28/2013   Right Chest (moderate)   Atypical mole 03/29/2014   Left Outer Upper Back (mild)   Atypical mole 03/29/2014   Left Axilla (mild)   Atypical mole 03/29/2014   Right Upper Outer Arm (mild)   Atypical mole 10/02/2014   Right Thumb (severe) (widershave)   Atypical mole 10/02/2014   Left Post Shoulder (moderate) (widershave)   Atypical mole 10/02/2014   Left Foot (mild) (widershave)   Atypical mole 10/02/2014   Right Shoulder (severe) (widershave)   Atypical mole 11/12/2016   Right Calf (proliferation)    Cataract    Glaucoma    Hx of sessile serrated colonic polyp 09/19/2018   Osteoporosis     Past Surgical History:  Procedure Laterality Date   ABDOMINAL HYSTERECTOMY     CATARACT EXTRACTION Left 2022   COLONOSCOPY      Prior to Admission medications   Medication Sig Start Date End Date Taking? Authorizing Provider  CALCIUM  PO Take 1,000 mg by mouth 3 (three) times daily. 09/25/19   [provider]  Cholecalciferol (VITAMIN D3) 2000 UNITS TABS Take by mouth.    [provider]  Coenzyme Q10 (CO Q-10) 100 MG CAPS Take by mouth.    [provider]  dorzolamide-timolol (COSOPT) 22.3-6.8 MG/ML ophthalmic solution  04/23/21   [provider]  fish oil-omega-3 fatty acids 1000 MG capsule Take 2 g by mouth daily.    [provider]  Menaquinone-7 (K2 PO) Take 1,000 mg by mouth.    [provider]  OVER THE COUNTER MEDICATION Turmeric 750 mg once daily    [provider]  OVER THE COUNTER MEDICATION Magnesium gly. 240 mg 2 per day    [provider]    Current Outpatient Medications  Medication Sig Dispense Refill   CALCIUM  PO Take 1,000 mg by mouth 3 (three) times daily.     Cholecalciferol (VITAMIN D3) 2000 UNITS TABS Take by mouth.     Coenzyme Q10 (CO Q-10)  100 MG CAPS Take by mouth.     dorzolamide-timolol (COSOPT) 22.3-6.8 MG/ML ophthalmic solution      fish oil-omega-3 fatty acids 1000 MG capsule Take 2 g by mouth daily.     Menaquinone-7 (K2 PO) Take 1,000 mg by mouth.     OVER THE COUNTER MEDICATION Turmeric 750 mg once daily     OVER THE COUNTER MEDICATION Magnesium gly. 240 mg 2 per day     Current Facility-Administered Medications  Medication Dose Route Frequency Provider Last Rate Last Admin   0.9 %  sodium chloride  infusion  500 mL Intravenous Once Avram Lupita BRAVO, MD        Allergies as of 10/21/2023 - Review Complete 10/21/2023  Allergen Reaction Noted   Shellfish allergy Hives 09/11/2018     Family History  Problem Relation Age of Onset   Hypertension Mother    Kidney disease Mother        stage 5 dialysis   Hypertension Father    Stroke Father    Osteoarthritis Maternal Grandmother    Colon polyps Neg Hx    Colon cancer Neg Hx    Esophageal cancer Neg Hx    Stomach cancer Neg Hx    Rectal cancer Neg Hx     Social History   Socioeconomic History   Marital status: Married    Spouse name: Not on file   Number of children: Not on file   Years of education: Not on file   Highest education level: Not on file  Occupational History   Not on file  Tobacco Use   Smoking status: Never   Smokeless tobacco: Never  Vaping Use   Vaping status: Never Used  Substance and Sexual Activity   Alcohol use: No   Drug use: No   Sexual activity: Yes    Birth control/protection: Surgical  Other Topics Concern   Not on file  Social History Narrative   Not on file   Social Drivers of Health   Financial Resource Strain: Not on file  Food Insecurity: Low Risk  (02/04/2023)   [facility]   Hunger Vital Sign    Within the past 12 months, you worried that your food would run out before you got money to buy more: Not on file    Ran Out of Food in the Last Year: Never true  Transportation Needs: No Transportation Needs (02/04/2023)   [facility]   Transportation    In the past 12 months, has lack of reliable transportation kept you from medical appointments, meetings, work or from getting things needed for daily living? : No  Physical Activity: Not on file  Stress: Not on file  Social Connections: Unknown (02/28/2022)   [facility]   Social Network    Social Network: Not on file  Intimate Partner Violence: Unknown (02/28/2022)   [facility] from [facility], [facility]   HITS    Physically Hurt: Not on file    Insult or Talk Down To: Not on file    Threaten Physical Harm: Not on  file    Scream or Curse: Not on file    Review of Systems:  All other review of systems negative except as mentioned in the HPI.  Physical Exam: Vital signs BP 138/85   Pulse 76   Temp (!) 97.5 F (36.4 C) (Skin)   Ht 5' 5.5 (1.664 m)   Wt 114 lb (51.7  kg)   SpO2 99%   BMI 18.68 kg/m   General:   Alert,  Well-developed, well-nourished, pleasant and cooperative in NAD Lungs:  Clear throughout to auscultation.   Heart:  Regular rate and rhythm; no murmurs, clicks, rubs,  or gallops. Abdomen:  Soft, nontender and nondistended. Normal bowel sounds.   Neuro/Psych:  Alert and cooperative. Normal mood and affect. A and O x 3   @Kagan Mutchler  CHARLENA Commander, MD, [facility] [phone] (pager) 10/21/2023 9:26 AM@

## 2023-10-21 ENCOUNTER — Encounter: Payer: Self-pay | Admitting: Internal Medicine

## 2023-10-21 ENCOUNTER — Ambulatory Visit (AMBULATORY_SURGERY_CENTER): Payer: Medicare Other | Admitting: Internal Medicine

## 2023-10-21 VITALS — BP 107/55 | HR 67 | Temp 97.5°F | Resp 13 | Ht 65.5 in | Wt 114.0 lb

## 2023-10-21 DIAGNOSIS — Z1211 Encounter for screening for malignant neoplasm of colon: Secondary | ICD-10-CM

## 2023-10-21 DIAGNOSIS — K644 Residual hemorrhoidal skin tags: Secondary | ICD-10-CM | POA: Diagnosis not present

## 2023-10-21 DIAGNOSIS — Z860101 Personal history of adenomatous and serrated colon polyps: Secondary | ICD-10-CM | POA: Diagnosis not present

## 2023-10-21 DIAGNOSIS — K648 Other hemorrhoids: Secondary | ICD-10-CM | POA: Diagnosis not present

## 2023-10-21 DIAGNOSIS — Z860100 Personal history of colon polyps, unspecified: Secondary | ICD-10-CM

## 2023-10-21 MED ORDER — SODIUM CHLORIDE 0.9 % IV SOLN: 500.0000 mL | Freq: Once | INTRAVENOUS | Status: DC

## 2023-10-21 NOTE — Progress Notes (Signed)
 Pt's states no medical or surgical changes since previsit or office visit.

## 2023-10-21 NOTE — Progress Notes (Signed)
 Sedate, gd SR, tolerated procedure well, VSS, report to RN

## 2023-10-21 NOTE — Op Note (Signed)
 [facility] Patient Name: Krystal Collins Procedure Date: 10/21/2023 9:12 AM MRN: [id number] Endoscopist: Lupita FORBES Commander , MD, [id number] Age: 68 Referring MD:  Date of Birth: April 26, 1956 Gender: Female Account #: [id number] Procedure:                Colonoscopy Indications:              High risk colon cancer surveillance: Personal                            history of sessile serrated colon polyp (less than                            10 mm in size) with no dysplasia, Last colonoscopy:                            2019 Medicines:                Monitored Anesthesia Care Procedure:                Pre-Anesthesia Assessment:                           - Prior to the procedure, a History and Physical                            was performed, and patient medications and                            allergies were reviewed. The patient's tolerance of                            previous anesthesia was also reviewed. The risks                            and benefits of the procedure and the sedation                            options and risks were discussed with the patient.                            All questions were answered, and informed consent                            was obtained. Prior Anticoagulants: The patient has                            taken no anticoagulant or antiplatelet agents. ASA                            Grade Assessment: II - A patient with mild systemic                            disease. After reviewing the risks and benefits,  the patient was deemed in satisfactory condition to                            undergo the procedure.                           After obtaining informed consent, the colonoscope                            was passed under direct vision. Throughout the                            procedure, the patient's blood pressure, pulse, and                            oxygen saturations were monitored continuously. The                             Olympus Scope [id number] was introduced through the                            anus and advanced to the the cecum, identified by                            appendiceal orifice and ileocecal valve. The                            colonoscopy was performed without difficulty. The                            patient tolerated the procedure well. The quality                            of the bowel preparation was good. The ileocecal                            valve, appendiceal orifice, and rectum were                            photographed. The bowel preparation used was SUPREP                            via split dose instruction. Scope In: 9:30:03 AM Scope Out: 9:43:50 AM Scope Withdrawal Time: 0 hours 8 minutes 6 seconds  Total Procedure Duration: 0 hours 13 minutes 47 seconds  Findings:                 The perianal and digital rectal examinations were                            normal.                           External and internal hemorrhoids were found.  The exam was otherwise without abnormality on                            direct and retroflexion views. Complications:            No immediate complications. Estimated Blood Loss:     Estimated blood loss: none. Impression:               - External and internal hemorrhoids.                           - The examination was otherwise normal on direct                            and retroflexion views.                           - No specimens collected.                           - Personal history of colonic polyp 1-2 mm sessile                            serrated polyp removed 2019. Recommendation:           - Patient has a contact number available for                            emergencies. The signs and symptoms of potential                            delayed complications were discussed with the                            patient. Return to normal activities tomorrow.                             Written discharge instructions were provided to the                            patient.                           - Resume previous diet.                           - Continue present medications.                           - No repeat colonoscopy due to age and the absence                            of colonic polyps. Lupita FORBES Commander, MD 10/21/2023 9:50:46 AM This report has been signed electronically.

## 2023-10-21 NOTE — Patient Instructions (Addendum)
 No polyps seen today.  At your age and no polyps today would not recommend a routine repeat colonoscopy.  I appreciate the opportunity to care for you. Lupita CHARLENA Commander, MD, [facility]  Discharge instructions given. Handout on Hemorrhoids. Resume previous. YOU HAD AN ENDOSCOPIC PROCEDURE TODAY AT THE [facility]:   Refer to the procedure report that was given to you for any specific questions about what was found during the examination.  If the procedure report does not answer your questions, please call your gastroenterologist to clarify.  If you requested that your care partner not be given the details of your procedure findings, then the procedure report has been included in a sealed envelope for you to review at your convenience later.  YOU SHOULD EXPECT: Some feelings of bloating in the abdomen. Passage of more gas than usual.  Walking can help get rid of the air that was put into your GI tract during the procedure and reduce the bloating. If you had a lower endoscopy (such as a colonoscopy or flexible sigmoidoscopy) you may notice spotting of blood in your stool or on the toilet paper. If you underwent a bowel prep for your procedure, you may not have a normal bowel movement for a few days.  Please Note:  You might notice some irritation and congestion in your nose or some drainage.  This is from the oxygen used during your procedure.  There is no need for concern and it should clear up in a day or so.  SYMPTOMS TO REPORT IMMEDIATELY:  Following lower endoscopy (colonoscopy or flexible sigmoidoscopy):  Excessive amounts of blood in the stool  Significant tenderness or worsening of abdominal pains  Swelling of the abdomen that is new, acute  Fever of 100F or higher   For urgent or emergent issues, a gastroenterologist can be reached at any hour by calling [phone]. Do not use MyChart messaging for urgent concerns.    DIET:  We do recommend a small meal at first, but  then you may proceed to your regular diet.  Drink plenty of fluids but you should avoid alcoholic beverages for 24 hours.  ACTIVITY:  You should plan to take it easy for the rest of today and you should NOT DRIVE or use heavy machinery until tomorrow (because of the sedation medicines used during the test).    FOLLOW UP: Our staff will call the number listed on your records the next business day following your procedure.  We will call around 7:15- 8:00 am to check on you and address any questions or concerns that you may have regarding the information given to you following your procedure. If we do not reach you, we will leave a message.     If any biopsies were taken you will be contacted by phone or by letter within the next 1-3 weeks.  Please call us  at [phone] if you have not heard about the biopsies in 3 weeks.    SIGNATURES/CONFIDENTIALITY: You and/or your care partner have signed paperwork which will be entered into your electronic medical record.  These signatures attest to the fact that that the information above on your After Visit Summary has been reviewed and is understood.  Full responsibility of the confidentiality of this discharge information lies with you and/or your care-partner.

## 2023-10-24 ENCOUNTER — Telehealth: Payer: Self-pay

## 2023-10-24 NOTE — Telephone Encounter (Signed)
  Follow up Call-     10/21/2023    9:00 AM  Call back number  Post procedure Call Back phone  # [phone]  Permission to leave phone message Yes     Patient questions:  Do you have a fever, pain , or abdominal swelling? No. Pain Score  0 *  Have you tolerated food without any problems? Yes.    Have you been able to return to your normal activities? Yes.    Do you have any questions about your discharge instructions: Diet   No. Medications  No. Follow up visit  No.  Do you have questions or concerns about your Care? No.  Actions: * If pain score is 4 or above: No action needed, pain <4.

## 2023-10-27 ENCOUNTER — Encounter: Payer: Medicare Other | Admitting: Internal Medicine

## 2024-04-11 ENCOUNTER — Other Ambulatory Visit: Payer: Self-pay | Admitting: Family Medicine

## 2024-04-11 DIAGNOSIS — Z1231 Encounter for screening mammogram for malignant neoplasm of breast: Secondary | ICD-10-CM

## 2024-05-02 ENCOUNTER — Ambulatory Visit
Admission: RE | Admit: 2024-05-02 | Discharge: 2024-05-02 | Disposition: A | Discharge status: Home / Self Care | Source: Ambulatory Visit | Attending: Family Medicine | Admitting: Family Medicine

## 2024-05-02 DIAGNOSIS — Z1231 Encounter for screening mammogram for malignant neoplasm of breast: Secondary | ICD-10-CM
